# Patient Record
Sex: Male | Born: 1955 | Race: White | Hispanic: No | Marital: Single | Smoking: Current every day smoker
Health system: Southern US, Community
[De-identification: ages and names within clinical notes are randomized; demographics above are authoritative.]

## PROBLEM LIST (undated history)

## (undated) DIAGNOSIS — E079 Disorder of thyroid, unspecified: Secondary | ICD-10-CM

## (undated) DIAGNOSIS — I639 Cerebral infarction, unspecified: Secondary | ICD-10-CM

## (undated) DIAGNOSIS — I10 Essential (primary) hypertension: Secondary | ICD-10-CM

## (undated) DIAGNOSIS — K632 Fistula of intestine: Secondary | ICD-10-CM

## (undated) DIAGNOSIS — B182 Chronic viral hepatitis C: Secondary | ICD-10-CM

## (undated) DIAGNOSIS — I2510 Atherosclerotic heart disease of native coronary artery without angina pectoris: Secondary | ICD-10-CM

## (undated) HISTORY — PX: COLON SURGERY: SHX602

---

## 2010-10-26 ENCOUNTER — Inpatient Hospital Stay: Payer: Self-pay | Admitting: Psychiatry

## 2011-02-03 ENCOUNTER — Inpatient Hospital Stay: Payer: Self-pay | Admitting: Internal Medicine

## 2011-02-08 LAB — PATHOLOGY REPORT

## 2011-06-04 ENCOUNTER — Inpatient Hospital Stay: Payer: Self-pay | Admitting: Specialist

## 2011-06-07 ENCOUNTER — Observation Stay: Payer: Self-pay | Admitting: Unknown Physician Specialty

## 2011-07-29 ENCOUNTER — Ambulatory Visit: Payer: Self-pay | Admitting: Specialist

## 2012-02-18 ENCOUNTER — Emergency Department: Payer: Self-pay | Admitting: Internal Medicine

## 2012-02-18 LAB — CBC
HCT: 47.2 % (ref 40.0–52.0)
HGB: 16.2 g/dL (ref 13.0–18.0)
MCH: 33.4 pg (ref 26.0–34.0)
MCHC: 34.3 g/dL (ref 32.0–36.0)
RBC: 4.85 10*6/uL (ref 4.40–5.90)
WBC: 6 10*3/uL (ref 3.8–10.6)

## 2012-02-18 LAB — BASIC METABOLIC PANEL
Anion Gap: 8 (ref 7–16)
Calcium, Total: 8.8 mg/dL (ref 8.5–10.1)
Chloride: 96 mmol/L — ABNORMAL LOW (ref 98–107)
Co2: 28 mmol/L (ref 21–32)
Creatinine: 0.87 mg/dL (ref 0.60–1.30)
EGFR (Non-African Amer.): >60
Glucose: 110 mg/dL — ABNORMAL HIGH (ref 65–99)
Potassium: 3.3 mmol/L — ABNORMAL LOW (ref 3.5–5.1)

## 2012-02-18 LAB — TROPONIN I: Troponin-I: <0.02 ng/mL

## 2012-04-23 LAB — COMPREHENSIVE METABOLIC PANEL
Bilirubin,Total: 0.7 mg/dL (ref 0.2–1.0)
Chloride: 95 mmol/L — ABNORMAL LOW (ref 98–107)
Co2: 26 mmol/L (ref 21–32)
Creatinine: 0.95 mg/dL (ref 0.60–1.30)
EGFR (African American): >60
EGFR (Non-African Amer.): >60
Osmolality: 263 (ref 275–301)
SGOT(AST): 58 U/L — ABNORMAL HIGH (ref 15–37)

## 2012-04-23 LAB — CBC
MCH: 33.9 pg (ref 26.0–34.0)
MCV: 97 fL (ref 80–100)
Platelet: 313 10*3/uL (ref 150–440)
RBC: 4.34 10*6/uL — ABNORMAL LOW (ref 4.40–5.90)
RDW: 14.2 % (ref 11.5–14.5)

## 2012-04-23 LAB — URINALYSIS, COMPLETE
Bilirubin,UR: NEGATIVE
Glucose,UR: NEGATIVE mg/dL (ref 0–75)
Nitrite: NEGATIVE
Protein: 30
RBC,UR: 35 /HPF (ref 0–5)
Squamous Epithelial: 2
WBC UR: 1492 /HPF (ref 0–5)

## 2012-04-23 LAB — CK TOTAL AND CKMB (NOT AT ARMC): CK, Total: 24 U/L — ABNORMAL LOW (ref 35–232)

## 2012-04-23 LAB — DRUG SCREEN, URINE
Barbiturates, Ur Screen: NEGATIVE (ref ?–200)
Benzodiazepine, Ur Scrn: NEGATIVE (ref ?–200)
Cannabinoid 50 Ng, Ur ~~LOC~~: NEGATIVE (ref ?–50)
Methadone, Ur Screen: NEGATIVE (ref ?–300)

## 2012-04-24 LAB — TROPONIN I: Troponin-I: <0.02 ng/mL

## 2012-04-24 LAB — CK TOTAL AND CKMB (NOT AT ARMC)
CK, Total: 20 U/L — ABNORMAL LOW (ref 35–232)
CK-MB: <0.5 ng/mL — ABNORMAL LOW (ref 0.5–3.6)
CK-MB: <0.5 ng/mL — ABNORMAL LOW (ref 0.5–3.6)

## 2012-04-25 ENCOUNTER — Inpatient Hospital Stay: Payer: Self-pay | Admitting: Internal Medicine

## 2012-04-25 LAB — CBC WITH DIFFERENTIAL/PLATELET
Basophil #: 0 10*3/uL (ref 0.0–0.1)
Basophil %: 0.7 %
HCT: 36.6 % — ABNORMAL LOW (ref 40.0–52.0)
HGB: 13 g/dL (ref 13.0–18.0)
Lymphocyte #: 1.3 10*3/uL (ref 1.0–3.6)
MCH: 34 pg (ref 26.0–34.0)
MCV: 95 fL (ref 80–100)
Monocyte #: 0.7 x10 3/mm (ref 0.2–1.0)
Monocyte %: 10.3 %
Neutrophil #: 4.5 10*3/uL (ref 1.4–6.5)
Neutrophil %: 68.2 %
Platelet: 223 10*3/uL (ref 150–440)
RDW: 13.8 % (ref 11.5–14.5)
WBC: 6.6 10*3/uL (ref 3.8–10.6)

## 2012-04-25 LAB — COMPREHENSIVE METABOLIC PANEL
Albumin: 2.4 g/dL — ABNORMAL LOW (ref 3.4–5.0)
Alkaline Phosphatase: 92 U/L (ref 50–136)
Bilirubin,Total: 0.7 mg/dL (ref 0.2–1.0)
Calcium, Total: 7.5 mg/dL — ABNORMAL LOW (ref 8.5–10.1)
Co2: 30 mmol/L (ref 21–32)
Creatinine: 0.84 mg/dL (ref 0.60–1.30)
EGFR (African American): >60
EGFR (Non-African Amer.): >60
Potassium: 3.4 mmol/L — ABNORMAL LOW (ref 3.5–5.1)
SGOT(AST): 55 U/L — ABNORMAL HIGH (ref 15–37)
SGPT (ALT): 35 U/L (ref 12–78)
Sodium: 135 mmol/L — ABNORMAL LOW (ref 136–145)

## 2012-04-25 LAB — LIPID PANEL
HDL Cholesterol: 15 mg/dL — ABNORMAL LOW (ref 40–60)
Ldl Cholesterol, Calc: 53 mg/dL (ref 0–100)

## 2012-04-25 LAB — MAGNESIUM: Magnesium: 1.3 mg/dL — ABNORMAL LOW

## 2012-04-27 LAB — BASIC METABOLIC PANEL
Calcium, Total: 7.9 mg/dL — ABNORMAL LOW (ref 8.5–10.1)
Co2: 31 mmol/L (ref 21–32)
EGFR (African American): >60
Osmolality: 273 (ref 275–301)
Potassium: 3.5 mmol/L (ref 3.5–5.1)
Sodium: 139 mmol/L (ref 136–145)

## 2012-04-28 ENCOUNTER — Ambulatory Visit: Payer: Self-pay | Admitting: Neurology

## 2012-04-29 LAB — CULTURE, BLOOD (SINGLE)

## 2013-02-28 ENCOUNTER — Emergency Department: Payer: Self-pay | Admitting: Emergency Medicine

## 2013-02-28 LAB — COMPREHENSIVE METABOLIC PANEL
Albumin: 3 g/dL — ABNORMAL LOW (ref 3.4–5.0)
Alkaline Phosphatase: 130 U/L (ref 50–136)
Calcium, Total: 8.2 mg/dL — ABNORMAL LOW (ref 8.5–10.1)
Chloride: 108 mmol/L — ABNORMAL HIGH (ref 98–107)
Glucose: 80 mg/dL (ref 65–99)
Potassium: 3.9 mmol/L (ref 3.5–5.1)
SGOT(AST): 57 U/L — ABNORMAL HIGH (ref 15–37)
SGPT (ALT): 42 U/L (ref 12–78)
Sodium: 143 mmol/L (ref 136–145)
Total Protein: 7.4 g/dL (ref 6.4–8.2)

## 2013-02-28 LAB — URINALYSIS, COMPLETE
Bacteria: NONE SEEN
Bilirubin,UR: NEGATIVE
Blood: NEGATIVE
Ketone: NEGATIVE
Leukocyte Esterase: NEGATIVE
Nitrite: NEGATIVE
Ph: 6 (ref 4.5–8.0)
RBC,UR: <1 /HPF (ref 0–5)
Squamous Epithelial: <1
WBC UR: <1 /HPF (ref 0–5)

## 2013-02-28 LAB — DRUG SCREEN, URINE
Amphetamines, Ur Screen: NEGATIVE (ref ?–1000)
Barbiturates, Ur Screen: NEGATIVE (ref ?–200)
Benzodiazepine, Ur Scrn: POSITIVE (ref ?–200)
MDMA (Ecstasy)Ur Screen: NEGATIVE (ref ?–500)

## 2013-02-28 LAB — CBC
HCT: 41.5 % (ref 40.0–52.0)
MCHC: 33.6 g/dL (ref 32.0–36.0)
Platelet: 344 10*3/uL (ref 150–440)
WBC: 7.5 10*3/uL (ref 3.8–10.6)

## 2013-02-28 LAB — ETHANOL: Ethanol: 259 mg/dL

## 2014-02-26 IMAGING — CR DG CHEST 1V PORT
1 series · 1 of 1 positions shown · non-contrast
Comparison: none

REASON FOR EXAM: trauma
COMMENTS:

[x chest ap]
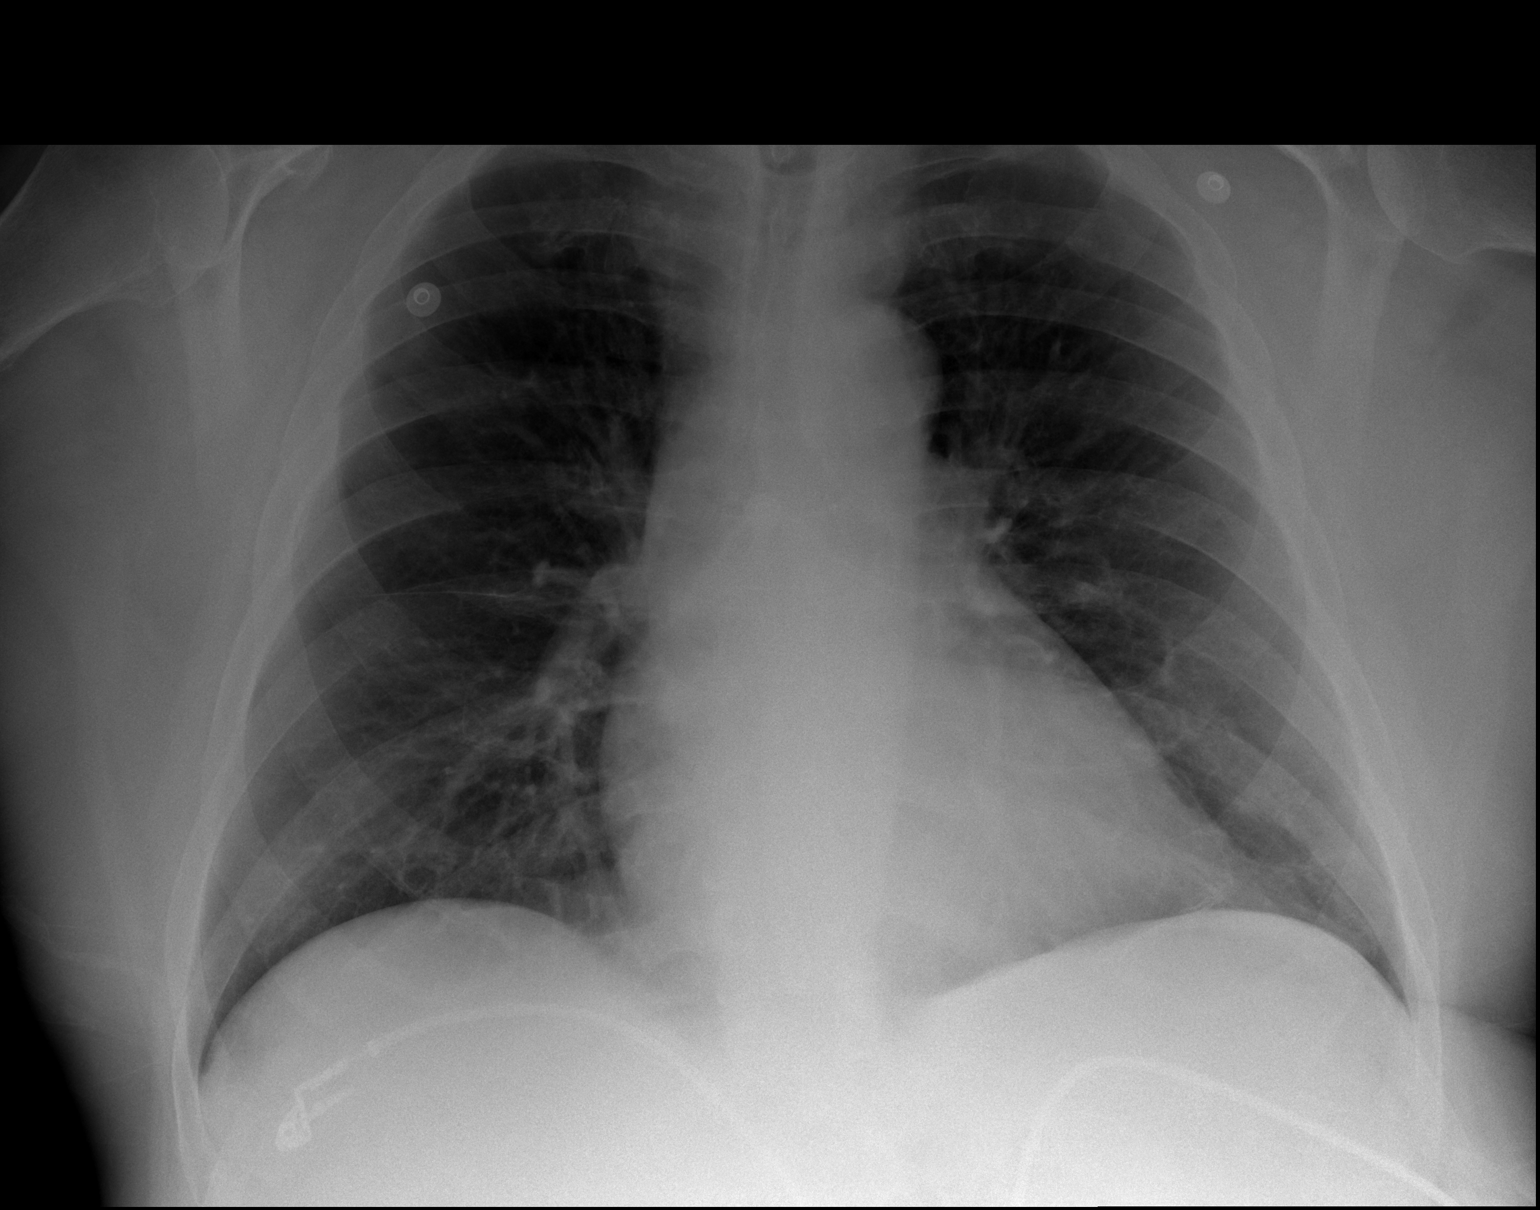

[1 of 1 positions shown; findings below may reference images not displayed]

PROCEDURE:     DXR - DXR PORTABLE CHEST SINGLE VIEW  - February 28, 2013  [DATE]

RESULT:     Comparison is made to the study dated 02/03/2011.

The lungs are clear. The heart and pulmonary vessels are normal. The bony
and mediastinal structures are unremarkable. There is no effusion. There is
no pneumothorax or evidence of congestive failure.
IMPRESSION: No acute cardiopulmonary disease.

[REDACTED]

## 2014-12-09 NOTE — Consult Note (Signed)
I spoke to his daughter who works in my office and she told me that the surgeon in [place] who was planning to do his surgery for the enteric-bladder fistula now recommends the patient go to the [facility] due to the carotid disease.  I spoke to the patient that his medical problems are complicated and he likely would be best served at a medical center like the [facility].   Electronic Signatures: Scot JunElliott, Robert T (MD)  (Signed on 06-Sep-13 21:21)  Authored  Last Updated: 06-Sep-13 21:21 by Scot JunElliott, Robert T (MD)

## 2014-12-09 NOTE — Consult Note (Signed)
I have contacted the [facility] in [place] and will complete the sheet for possible transfer but I do not expect a likely acceptance.  Electronic Signatures: Scot JunElliott, Jimena Wieczorek T (MD)  (Signed on 07-Sep-13 19:18)  Authored  Last Updated: 07-Sep-13 19:18 by Scot JunElliott, Maryama Kuriakose T (MD)

## 2014-12-09 NOTE — Consult Note (Signed)
PATIENT NAME:  [name] Hunter, [name] Hunter MR#:  [id number] DATE OF BIRTH:  1956-04-08  DATE OF CONSULTATION:  04/28/2012  CONSULTING PHYSICIAN:  Walden Statz K. Gearold Wainer, MD  IDENTIFYING INFORMATION: The patient is a 59 year old white male, not employed, disabled with mental disorder and posttraumatic stress disorder and is a [place] veteran.  The patient is divorced for many years and lives by himself.   CHIEF COMPLAINT: The patient comes to [facility] with a chief complaint, "intestinal problems."    PAST PSYCHIATRIC HISTORY: History of inpatient on Psychiatry once before at [facility] for suicide. He talked of suicide but did not make any attempt. He is being followed by [facility] for his posttraumatic stress disorder. He attends classes for the same. He has an appointment coming up very soon.   ALCOHOL AND DRUGS: He admits that he drinks alcohol at a rate of 6 to 7 beers per day.  He does admit to four DWIs. He has not had his driver's license in over 20 years.  He does smoking nicotine cigarettes at a rate of a pack a day for many years. He denies any other street or prescription drug abuse.   MENTAL STATUS EXAM: The patient is alert and oriented to place, person and time, upset and irritable when questions are asked.  He does admit feeling depressed.  He admits feeling hopeless and helpless, worthless and useless about himself. He reports that his mother died in February of 2013, and he did not have a chance or did not have time to overcome from the grief reaction from the same.  He denies suicidal or homicidal attempts.  No evidence of psychosis.  He denies auditory or visual hallucinations. He denies hearing voices, seeing things. He does admit being paranoid and suspicious about people around.  He does admit to flashbacks and nightmares from picking up dead bodies in [place]. Memory is intact.  Cognition is intact.  General knowledge and information is fair.  He  denies any ideas or plans to hurt himself or others.  Insight and judgment are guarded.   IMPRESSION:  1. Posttraumatic stress disorder, chronic, with depression.  2. Bereavement from death of mother in February 2013 with grief reaction and depression secondary to the same.  3. Alcohol dependence, chronic, continuous.  4. Nicotine dependence, chronic, continuous.   RECOMMENDATIONS:  1. Continue current medications of antidepressants, that is Effexor and Geodon.  Add Ambien 10 mg p.o. at bedtime to help him rest better.  2. The patient has an [facility] coming [facility], and he attends posttraumatic stress disorder classes and will continue to do so.   ____________________________ Jannet MantisSurya K. Guss Bundehalla, MD skc:cbb D: 04/28/2012 20:00:30 ET T: 04/29/2012 11:24:50 ET JOB#: [id number]  cc: Monika SalkSurya K. Guss Bundehalla, MD, <Dictator> Beau FannySURYA K Jakolby Sedivy MD ELECTRONICALLY SIGNED 04/29/2012 17:14

## 2014-12-09 NOTE — Consult Note (Signed)
CC: colo-vescicular fistula, alcoholism, syncope.  Pt VSS stable, postural BP much better, 135/80 supine, 112/72 standing.  Talked to Dr. Dava NajjarPanwar and he will go home tomorrow unless [facility] can take him in transfer.  He was last at [facility] 2 months ago seeing a psychiatrist.  I went over with him the improvements and accomplishments of this hospitalization and encouraged him to not drink when he leaves.  I will contact [facility] and see if they will take him tomorrow.and if not then he can go home and follow up with me every few days until he is seen at [facility]/  Electronic Signatures: Scot JunElliott, Robert T (MD)  (Signed on 08-Sep-13 10:07)  Authored  Last Updated: 08-Sep-13 10:07 by Scot JunElliott, Robert T (MD)

## 2014-12-09 NOTE — Consult Note (Signed)
CC: syncope.  Pt daughter spoke to surgeon in [place] area and the surgeon has agreed to take his case. I think he can go home and follow up prn with me. Discussed with Dr. Florina OuPanwahr  Electronic Signatures: Scot JunElliott, Sherisse Fullilove T (MD)  (Signed on 10-Sep-13 11:31)  Authored  Last Updated: 10-Sep-13 11:31 by Scot JunElliott, Choua Chalker T (MD)

## 2014-12-09 NOTE — Discharge Summary (Signed)
PATIENT NAME:  [name] Hunter, [name] Hunter MR#:  [id number] DATE OF BIRTH:  Aug 02, 1956  DATE OF ADMISSION:  04/25/2012 DATE OF DISCHARGE:  05/01/2012  DISCHARGE DIAGNOSES:  1. Acute punctate nonhemorrhagic pontine stroke. 2. Syncope due to orthostasis.  3. 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 5. Post traumatic stress disorder. 6. Anxiety and depression. 7. Hypertension. 8. Gastroesophageal reflux disease. 9. Smoking and alcohol abuse. 10. Hepatitis C.  11. History of polysubstance abuse in the past. 12. Gastroesophageal reflux disease. 13. Sleep apnea, not tolerating CPAP.   DISPOSITION: The patient is being discharged home with home health and physical therapy.   CONSULTANTS:  1. Lynnae Prude, MD - Gastroenterology. 2. Suzan Slick, MD - Neurology. 3. Beryle Beams, MD - Neurology. 4. Margarita Rana, MD - Psychiatry. 5. Dionne Milo, MD - Surgery. 6. Festus Barren, MD - Vascular Surgery.  RESULTS: Microbiology - Blood cultures with no growth so far. Urine culture with gram-positive rods, likely colonization.  White count normal, hemoglobin 13, and platelet count normal at 223. Glucose 145, creatinine 0.95, and sodium 131 on admission and normal by the time of discharge. Other electrolytes were normal. VLDL 23, LDL 53, cholesterol 91, triglycerides 117, and HDL 15. LFTs normal, other than AST of 55. Cardiac enzymes normal. Urine drug screen positive for opiates and tricyclic-like antidepressants.   Echocardiogram was essentially a normal study.   CT angiogram showed complete occlusion of the left common carotid artery to the level of the carotid bulb. Mild to moderate area of stenosis within the carotid bulb on the right.  MRI of the brain showed acute nonhemorrhagic punctate central pontine infarct.   MRA of the brain showed absence of the left intracranial internal carotid artery consistent with occlusion with reconstitution of the left MCA and ACA.   Carotid  ultrasound showed left carotid stenosis of 40 to 45%, right carotid stenosis.  Knee x-ray: Bilateral knee x-ray showed no acute abnormalities.   EEG showed diffuse background slowing.   DISCHARGE INSTRUCTIONS: The patient is being discharged home. He is to followup with Dr. Mechele Collin, PCP at [facility], Dr. Wyn Quaker, and Dr. Kemper Durie in 1 to 2 weeks after discharge. The patient has been advised to wear TED/compression stockings.   DIET: Low sodium.   DISCHARGE MEDICATIONS:  1. Omeprazole 20 mg daily.  2. Morphine 30 mg twice a day. 3. Morphine immediate release 15 mg every six hours p.r.n.  4. Cipro 500 mg twice a day. 5. Flagyl 500 mg three times daily. 6. Venlafaxine 225 mg once a day.  7. Simvastatin 20 mg daily.  8. Aspirin 81 mg daily.  9. Plavix 75 mg daily.  10. Lopressor 25 mg twice a day. 11. Ambien 10 mg at bedtime.   HOSPITAL COURSE: The patient is a 59 year old male with past medical history of polysubstance abuse, ongoing smoking, alcohol abuse, hepatitis C, PTSD, anxiety, depression, and hypertension who had been having recurrent syncopal episodes. Therefore, he was brought in by his family for evaluation. 1. Syncope. The differential diagnosis included orthostasis, medications including HCTZ/Geodon, carotid stenosis, and alcohol abuse. He had extensive work-up including echo which was essentially normal. His carotid ultrasound showed that he had occlusion of the carotid artery on the left side. Therefore CTA was done which showed 100% blockage in the left and mild to moderate on the right. Vascular surgery consultation with Dr. Wyn Quaker was obtained who felt that no intervention could be done on the left side due to chronic occlusion. He recommended doing ultrasounds  every 3 to 6 months to check for progression of the right side and evaluation for stents at that time. The patient was orthostatic and that was felt to be the primary reason for his syncope. A neurology consultation was obtained  because the family was very concerned. They also felt that the patient's syncope was related to orthostasis. EEG was done which showed no epileptiform activity other than mild generalized background slowing. MRA showed occluded left internal carotid artery. Psychiatry was also consulted to adjust the patient's medications who felt that the patient was stable on his current medication regimen. 2. Acute cerebrovascular accident. The patient was found to have acute nonhemorrhagic punctate central pontine infarct on his MRI of the brain. He is already on aspirin and Plavix and statin therapy. He was evaluated by physical therapy and home health was recommended.  3. Enterovesical fistula. The patient has surgery scheduled as an outpatient for repair of his fistula at the [facility]. He is on antibiotics, Cipro and Flagyl, which were continued. The patient has intermittently been passing stool in his urine and had a positive urinalysis initially. The urine culture grew gram-positive rods which was likely colonization. Given the patient's carotid stenosis, he is at high risk of having stroke during surgery.  4. Alcohol abuse. The patient abuses alcohol daily and was placed on CIWA protocol. He did go into alcohol withdrawal, but has completed his withdrawal and is currently not having any withdrawal symptoms. 5. Hypertension. Because of orthostatic hypotension, the patient's diuretic therapy has been discontinued. He is on a beta blocker.    DISPOSITION: The patient had a lot of social issues and his family initially wanted [facility] to be [facility]. [facility] and they accepted him, but a bed was not available. After much discussion amongst themselves, the family finally decided to take the patient home with home health and physical therapy. The patient was discharged home in a stable condition.   TIME SPENT: 45 minutes. ____________________________ Darrick MeigsSangeeta Raphaella Larkin,  MD sp:slb D: 05/01/2012 15:50:36 ET T: 05/02/2012 10:38:59 ET JOB#: [id number]  cc: Darrick MeigsSangeeta Javis Abboud, MD, <Dictator> [facility]; FAX: [phone]) Darrick MeigsSANGEETA Juniel Groene MD ELECTRONICALLY SIGNED 05/02/2012 15:58

## 2014-12-09 NOTE — Consult Note (Signed)
Brief Consult Note: Diagnosis: colovesical fistula.   Patient was seen by consultant.   Consult note dictated.   Comments: pt has [facility] scheduled with [facility] in [facility] weeks. No surgical needs here this admission. will sign off.  Electronic Signatures: Lattie Hawooper, Megumi Treaster E (MD)  (Signed 06-Sep-13 13:31)  Authored: Brief Consult Note   Last Updated: 06-Sep-13 13:31 by Lattie Hawooper, Riannah Stagner E (MD)
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
## 2014-12-09 NOTE — Consult Note (Signed)
Patient admitted with syncope.  US suggests left ICA occlusion and maybe 50% right ICA stenosis although velocities may be elevated from contralateral occlusion.  Will get CT angiogram to confirm.  If correct, no surgical or interventional therapy of benefit.  Will see in office in follow up to watch patent carotid ongoing  Electronic Signatures: Annice Needyew, Jason S (MD)  (Signed on 04-Sep-13 10:32)  Authored  Last Updated: 04-Sep-13 10:32 by Annice Needyew, Jason S (MD)

## 2014-12-09 NOTE — Consult Note (Signed)
PATIENT NAME:  [name] Hunter, [name] Hunter MR#:  [id number] DATE OF BIRTH:  December 18, 1955  DATE OF CONSULTATION:  04/30/2012  REFERRING PHYSICIAN:   CONSULTING PHYSICIAN:  Rose Phi. 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 Katrinka Blazing at [facility] with past history of posttraumatic stress disorder, depression and anxiety, agitation, hypertension, obesity, untreated sleep apnea, hepatitis C, 1-1/2 pack per day tobacco use, 6 to 7 24-ounce beer per day ethanol abuse, and recent diagnosis of enterovesical fistula. He was admitted early 04/24/2012 for weakness and loss of consciousness and is seen today regarding stroke. History comes from the patient and from his hospital chart.   The patient was brought to the Emergency Room at 4:20 p.m. on 04/23/2012 by his daughter whom the patient had called secondary to feeling somewhat weak and passing liquid stool while urination. ER nurse and physician and admitting physician does give history of episodes of loss of consciousness. He was seen by Dr. Olin Pia of neurology on 04/28/2012 regarding syncope.   The patient is a fair historian, poor with regard to chronology of spells including falling and with regard to total number of episodes, and fairly good on giving description of the episodes themselves. He reports episodes occurring when on his feet and he will have the onset of feeling about to pass out and will be wobbly for a few seconds before passing out and falling. On one occasion he was able to walk a few steps from the kitchen to the den where he sat or collapsed sitting into a chair without loss of consciousness. With one spell, perhaps his first episode, dizziness began while standing urinating. Otherwise all spells had begun while standing or walking and none have been associated with standing up, looking up, or bending over. He reports  being wobbly on his feet without feeling of presyncope when going to the bathroom with nurse on Friday, 04/27/2012 and reports that this was the case later the same day when walking to the bathroom as well. Since then he has not felt similarly unsteady, but has been using a two-wheeled walker brought to his room because of feeling "unsure".    He has had orthostatic blood pressure and heart rate checks the morning of 09/08 and 04/30/2012. Change in mean arterial blood pressure was 17 mm of mercury on the 8th and 15 on the 9th. His workup in the hospital has been most notable for finding of occlusion of the left common carotid artery. CT arteriogram had shown there was some flow in the left internal and external carotid arteries. Brain MRI scan performed this morning shows a very small spot of altered signal intensity of the central pons on diffusion weighted imaging, consistent with recent nonhemorrhagic stroke. Patient denies any prior history of episodes of stroke or temporary stroke.   PHYSICAL EXAMINATION: Patient is a well-developed, moderately overweight white gentleman who is examined lying semisupine in no apparent distress. Blood pressure 130/80 and heart rate 92. There is no fever. He was normocephalic without evidence of trauma. His neck was supple. He was alert and was oriented with clear speech and normal expression and normal affect. He was a fair historian as noted above. Cranial nerve examination showed symmetric facial appearance at rest and with directed movements with conversation, normal facial sensation, normal eye movements full visual fields for each eye; visual acuity was not tested. Hearing acuity was normal. Examination  of the oropharynx showed normal midline uvula elevation with phonation and normal appearance of tongue and normal tongue movements. Motor examination of the extremities showed normal tone and bulk throughout and symmetric full power throughout. Extremity coordination was  normal. His gait was not tested.   IMPRESSION:  1. 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 I cannot rule out possibility of superimposed cardiac dysrhythmia. From the description of his episodes, I do not have strong suspicion of seizures.  2. Very small area of recent nonhemorrhagic strokes in the central pons on brain MRI scan. Historically, I suspect that this may be residual of small stroke on Friday, 04/27/2012, when he was unsteady on his feet with the nurse getting back and forth to the bathroom.   RECOMMENDATIONS:  1. I agree with his present work-up and treatment in the hospital including initiation of moderate compression thigh-high support hose during the day.  2. He will benefit from being on daily aspirin as antiplatelet agent.  3. He is encouraged to consider tapering and discontinuing ethanol and tobacco use.  4. I do not expect that his very small area of stroke seen on MRI scan likely present results in neurologic deficit. I do not expect that this area poses a particular risk with regard to procedures such as anticipated surgery for his enterovesical fistula. He is at risk for any procedure in the setting of insufficient cerebrovascular blood flow from carotid artery occlusion on the left.   I appreciate being asked to see this pleasant and interesting gentleman.   ____________________________ Rose PhiPeter R. Kemper Durielarke, MD prc:cms D: 04/30/2012 17:46:18 ET T: 05/01/2012 09:14:23  ET JOB#: [id number]  cc: Rose PhiPeter R. Kemper Durielarke, MD, <Dictator>  Gaspar GarbePETER R Jennise Both MD ELECTRONICALLY SIGNED 05/07/2012 13:14
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
## 2014-12-09 NOTE — Consult Note (Signed)
CC: syncope.  Pt daughter works in my office and she asked me to come in on the case.  I talked to Dr.Dew about surgery on his abd in face of the carotid disease and he felt it was likely stable enough to go ahead with surgery.  This would be somewhat difficult surgery due to the complexity of the colovescicular problem but no need to postpone this to October from the carotid standpoint.  Please consult Dr. Michela [facility] for possible surgery when his DT's clear up and will likely need urologist.  Pt is an alcoholic and family concerned he will start drinking again if goes home before surgery done.  I will follow with you;.  Electronic Signatures: Scot JunElliott, Toniesha Zellner T (MD)  (Signed on 05-Sep-13 18:10)  Authored  Last Updated: 05-Sep-13 18:10 by Scot JunElliott, Nathyn Luiz T (MD)
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
## 2014-12-09 NOTE — Consult Note (Signed)
PATIENT NAME:  [name] Hunter, [name] Hunter#:  [id number] DATE OF BIRTH:  [birth year]  DATE OF CONSULTATION:  04/27/2012  REFERRING PHYSICIAN:   CONSULTING PHYSICIAN:  Adah Salvageichard E. Excell Seltzerooper, MD  CHIEF COMPLAINT: Colovesical fistula.   HISTORY OF PRESENT ILLNESS: This is a 59 year old Caucasian male patient. He has been admitted to the hospital for weakness and syncopal episodes and has been undergoing a considerable work-up for carotid disease.   I was asked to see the patient for the known diagnosis of colovesical fistula or enterovesical fistula.   The patient describes passage of both stool and gas or air via his urine and that's been going on for several weeks. He cannot remember exactly when. He has also had occasional or frequent urinary tract infections. He has been seen by [facility]rology team at [facility]. [facility] [place] but [facility] and has a planned operation for repair of his colovesical fistula by that team on October 3rd.    The patient has no new complaints today over what his normal problems have been recently.   PAST MEDICAL HISTORY:  1. PTSD. 2. Reflux disease. 3. Anxiety/depression. 4. Tobacco abuse.  5. Hypertension.  6. Sleep apnea.  7. Hepatitis C.  8. Alcohol abuse. 9. Drug abuse.   PAST SURGICAL HISTORY:  1. Knee surgery. 2. Recent colonoscopy and cystoscopy.   MEDICATIONS:  1. Antibiotics. 2. Analgesics including narcotic analgesics. 3. Venlafaxine. 4. Norvasc. 5. Omeprazole. 6. Hydrochlorothiazide.   SOCIAL HISTORY: The patient smokes tobacco. Has a history of alcohol and polysubstance abuse. He is not employed at this point.  REVIEW OF SYSTEMS: 10 system review was performed and negative with the exception of that mentioned in the history of present illness.   PHYSICAL EXAMINATION:   GENERAL: Comfortable appearing Caucasian male patient with a BMI of 28.   VITAL SIGNS:  Temperature 99.3, pulse 75, respirations 18, blood pressure 128/82, 95% room air sat.   HEENT: Poor dentition.   NECK: No palpable neck nodes.   CHEST: Clear to auscultation.   CARDIAC: Regular rate and rhythm.   ABDOMEN: Soft, nontender, nondistended. No scars are noted.   EXTREMITIES: Without edema.   NEUROLOGIC: Grossly intact.   INTEGUMENTARY: No jaundice.   LABORATORY, DIAGNOSTIC, AND RADIOLOGICAL DATA: Laboratory values demonstrate a normal set of electrolytes done today. Prior labs done on the 4th demonstrated a creatinine of 0.84, otherwise normal electrolytes and a hemogram showing a white blood cell count of 6.6, hemoglobin and hematocrit of 13 and 37, and a platelet count of 223. No imaging studies are available concerning his abdomen.   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 He came to the hospital on p.o. antibiotics and is being maintained on antibiotics. He is scheduled [facility] for repair of this colovesical or enterovesical fistula. I have nothing to add surgically. He is not in any acute distress concerning this and will be well cared for with the [facility]rologic team [facility].   Thank you for this consult. If you have any questions I can certainly see the patient again while he is in the hospital for work-up of his vascular disease.   ____________________________ Adah Salvageichard E. Excell Seltzerooper, MD rec:drc D: 04/27/2012 18:16:04 ET T: 04/28/2012 11:37:29 ET JOB#: [id number]  cc: Adah Salvageichard E. Excell Seltzerooper, MD, <Dictator> Lattie HawICHARD E Slyvester Latona MD ELECTRONICALLY SIGNED  04/29/2012 14:13 

## 2015-02-10 ENCOUNTER — Encounter: Payer: Self-pay | Admitting: Occupational Medicine

## 2015-02-10 ENCOUNTER — Other Ambulatory Visit: Payer: Self-pay

## 2015-02-10 ENCOUNTER — Emergency Department: Payer: Medicare Other

## 2015-02-10 ENCOUNTER — Emergency Department
Admission: EM | Admit: 2015-02-10 | Discharge: 2015-02-11 | Disposition: A | Discharge status: Home / Self Care | Payer: Medicare Other | Attending: Emergency Medicine | Admitting: Emergency Medicine

## 2015-02-10 DIAGNOSIS — R079 Chest pain, unspecified: Secondary | ICD-10-CM

## 2015-02-10 DIAGNOSIS — Z720 Tobacco use: Secondary | ICD-10-CM | POA: Insufficient documentation

## 2015-02-10 DIAGNOSIS — F10129 Alcohol abuse with intoxication, unspecified: Secondary | ICD-10-CM | POA: Insufficient documentation

## 2015-02-10 DIAGNOSIS — R0789 Other chest pain: Secondary | ICD-10-CM | POA: Insufficient documentation

## 2015-02-10 DIAGNOSIS — I10 Essential (primary) hypertension: Secondary | ICD-10-CM | POA: Insufficient documentation

## 2015-02-10 DIAGNOSIS — F10920 Alcohol use, unspecified with intoxication, uncomplicated: Secondary | ICD-10-CM

## 2015-02-10 HISTORY — DX: Disorder of thyroid, unspecified: E07.9

## 2015-02-10 HISTORY — DX: Essential (primary) hypertension: I10

## 2015-02-10 HISTORY — DX: Cerebral infarction, unspecified: I63.9

## 2015-02-10 HISTORY — DX: Chronic viral hepatitis C: B18.2

## 2015-02-10 HISTORY — DX: Atherosclerotic heart disease of native coronary artery without angina pectoris: I25.10

## 2015-02-10 HISTORY — DX: Fistula of intestine: K63.2

## 2015-02-10 LAB — URINALYSIS COMPLETE WITH MICROSCOPIC (ARMC ONLY)
Bilirubin Urine: NEGATIVE
Glucose, UA: NEGATIVE mg/dL
Hgb urine dipstick: NEGATIVE
KETONES UR: NEGATIVE mg/dL
Leukocytes, UA: NEGATIVE
Nitrite: NEGATIVE
PH: 6 (ref 5.0–8.0)
Protein, ur: NEGATIVE mg/dL
SQUAMOUS EPITHELIAL / LPF: NONE SEEN
Specific Gravity, Urine: 1.002 — ABNORMAL LOW (ref 1.005–1.030)

## 2015-02-10 LAB — CBC WITH DIFFERENTIAL/PLATELET
Basophils Absolute: 0 10*3/uL (ref 0–0.1)
Basophils Relative: 1 %
Eosinophils Absolute: 0 10*3/uL (ref 0–0.7)
Eosinophils Relative: 1 %
HCT: 42.2 % (ref 40.0–52.0)
Hemoglobin: 14 g/dL (ref 13.0–18.0)
LYMPHS PCT: 35 %
Lymphs Abs: 2.2 10*3/uL (ref 1.0–3.6)
MCH: 30 pg (ref 26.0–34.0)
MCHC: 33.2 g/dL (ref 32.0–36.0)
MCV: 90.3 fL (ref 80.0–100.0)
MONOS PCT: 8 %
Monocytes Absolute: 0.5 10*3/uL (ref 0.2–1.0)
NEUTROS PCT: 55 %
Neutro Abs: 3.4 10*3/uL (ref 1.4–6.5)
Platelets: 172 10*3/uL (ref 150–440)
RBC: 4.67 MIL/uL (ref 4.40–5.90)
RDW: 15.5 % — ABNORMAL HIGH (ref 11.5–14.5)
WBC: 6.2 10*3/uL (ref 3.8–10.6)

## 2015-02-10 LAB — TROPONIN I: Troponin I: <0.03 ng/mL (ref <0.031)

## 2015-02-10 LAB — COMPREHENSIVE METABOLIC PANEL
ALT: 64 U/L — AB (ref 17–63)
ANION GAP: 14 (ref 5–15)
AST: 71 U/L — ABNORMAL HIGH (ref 15–41)
Albumin: 3.9 g/dL (ref 3.5–5.0)
Alkaline Phosphatase: 91 U/L (ref 38–126)
BUN: 7 mg/dL (ref 6–20)
CALCIUM: 9.4 mg/dL (ref 8.9–10.3)
CO2: 24 mmol/L (ref 22–32)
CREATININE: 0.77 mg/dL (ref 0.61–1.24)
Chloride: 104 mmol/L (ref 101–111)
GLUCOSE: 77 mg/dL (ref 65–99)
Potassium: 3.7 mmol/L (ref 3.5–5.1)
SODIUM: 142 mmol/L (ref 135–145)
Total Bilirubin: 0.6 mg/dL (ref 0.3–1.2)
Total Protein: 7.8 g/dL (ref 6.5–8.1)

## 2015-02-10 LAB — LIPASE, BLOOD: Lipase: 48 U/L (ref 22–51)

## 2015-02-10 LAB — ETHANOL: Alcohol, Ethyl (B): 115 mg/dL — ABNORMAL HIGH (ref <5)

## 2015-02-10 MED ORDER — ASPIRIN 81 MG PO CHEW
CHEWABLE_TABLET | ORAL | Status: AC
  Administered 2015-02-10: 324 mg via ORAL
  Filled 2015-02-10: qty 4

## 2015-02-10 MED ORDER — NITROGLYCERIN 0.4 MG SL SUBL
SUBLINGUAL_TABLET | SUBLINGUAL | Status: AC
  Filled 2015-02-10: qty 1

## 2015-02-10 MED ORDER — GI COCKTAIL ~~LOC~~
30.0000 mL | Freq: Once | ORAL | Status: AC
Start: 2015-02-10 — End: 2015-02-10
  Administered 2015-02-10: 30 mL via ORAL

## 2015-02-10 MED ORDER — NITROGLYCERIN 0.4 MG SL SUBL
SUBLINGUAL_TABLET | SUBLINGUAL | Status: AC
  Administered 2015-02-10: 0.4 mg via SUBLINGUAL
  Filled 2015-02-10: qty 1

## 2015-02-10 MED ORDER — GI COCKTAIL ~~LOC~~
ORAL | Status: AC
  Administered 2015-02-10: 30 mL via ORAL
  Filled 2015-02-10: qty 30

## 2015-02-10 MED ORDER — ASPIRIN 81 MG PO CHEW
324.0000 mg | CHEWABLE_TABLET | Freq: Once | ORAL | Status: AC
  Administered 2015-02-10: 324 mg via ORAL

## 2015-02-10 MED ORDER — NITROGLYCERIN 0.4 MG SL SUBL
0.4000 mg | SUBLINGUAL_TABLET | SUBLINGUAL | Status: DC | PRN
  Administered 2015-02-10 (×2): 0.4 mg via SUBLINGUAL

## 2015-02-10 MED ORDER — SODIUM CHLORIDE 0.9 % IV BOLUS (SEPSIS)
1000.0000 mL | Freq: Once | INTRAVENOUS | Status: AC
  Administered 2015-02-10: 1000 mL via INTRAVENOUS

## 2015-02-10 NOTE — ED Provider Notes (Signed)
[facility] Emergency Department Provider Note  ____________________________________________  Time seen: Approximately 930 PM  I have reviewed the triage vital signs and the nursing notes.   HISTORY  Chief Complaint Chest Pain    HPI [name] Hunter is a 59 y.o. male with a history of CAD and stroke who presents today with 3-4 days of left-sided chest pain. The patient says that the pain is sharp and nonradiating. There are no exacerbating factors. There is no shortness of breath, nausea or vomiting associated with it. However he does have associated dizziness. The patient says that this past March that he was catheterized at the [facility] for chest pain with elevated troponins but did not have any stents because he did not have any stents of the lesions. The patient usually takes a daily aspirin of 324 mg but has not taken it today. He also has nitroglycerin at home which she also did not take today. He says that the pain can last up to 30 minutes at a time. He says that he is having pain right now which she describes as left-sided and moderate. He also admits to drinking 2 beers earlier tonight.Despite initial triage complaint of abdominal pain the patient denies any abdominal pain. Says that it is all left-sided chest pain and not left upper quadrant abdominal pain.   Past Medical History  Diagnosis Date  . Coronary artery disease   . Hypertension   . Thyroid disease   . Stroke   . Colonic fistula   . Hep C w/o coma, chronic     There are no active problems to display for this patient.   Past Surgical History  Procedure Laterality Date  . Colon surgery      No current outpatient prescriptions on file.  Allergies Codeine  History reviewed. No pertinent family history.  Social History History  Substance Use Topics  . Smoking status: Current Every Day Smoker    Types: Cigarettes  . Smokeless tobacco: Not on file  . Alcohol Use: Yes     Review of Systems Constitutional: No fever/chills Eyes: No visual changes. ENT: No sore throat. Cardiovascular: As above  Respiratory: Denies shortness of breath. Gastrointestinal: No abdominal pain.  No nausea, no vomiting.  No diarrhea.  No constipation. Genitourinary: Negative for dysuria. Musculoskeletal: Negative for back pain. Skin: Negative for rash. Neurological: Negative for headaches, focal weakness or numbness.  10-point ROS otherwise negative.  ____________________________________________   PHYSICAL EXAM:  VITAL SIGNS: ED Triage Vitals  Enc Vitals Group     BP 02/10/15 2100 151/91 mmHg     Pulse Rate 02/10/15 2100 93     Resp 02/10/15 2100 17     Temp --      Temp src --      SpO2 02/10/15 2100 95 %     Weight --      Height --      Head Cir --      Peak Flow --      Pain Score 02/10/15 2106 9     Pain Loc --      Pain Edu? --      Excl. in GC? --     Constitutional: Alert and oriented. Well appearing and in no acute distress. Eyes: Conjunctivae are normal. PERRL. EOMI. Head: Atraumatic. Nose: No congestion/rhinnorhea. Mouth/Throat: Mucous membranes are moist.  Oropharynx non-erythematous. Neck: No stridor.   Cardiovascular: Normal rate, regular rhythm. Grossly normal heart sounds.  Good peripheral circulation. Respiratory:  Normal respiratory effort.  No retractions. Lungs CTAB. Gastrointestinal: Soft and nontender. No distention. No abdominal bruits. No CVA tenderness. Has left lower quadrant colostomy. Was performed remotely secondary to fistula. Daughter says fistula was secondary to severe constipation. No tenderness surrounding colostomy. No blood in the colostomy bag. No surrounding erythema or pus. Colostomy mucosa appears pink and healthy. Musculoskeletal: No lower extremity tenderness nor edema.  No joint effusions. Neurologic:  Normal speech and language. No gross focal neurologic deficits are appreciated. Speech is normal. No gait  instability. Skin:  Skin is warm, dry and intact. No rash noted. Psychiatric: Mood and affect are normal. Speech and behavior are normal.  ____________________________________________   LABS (all labs ordered are listed, but only abnormal results are displayed)  Labs Reviewed  CBC WITH DIFFERENTIAL/PLATELET - Abnormal; Notable for the following:    RDW 15.5 (*)    All other components within normal limits  COMPREHENSIVE METABOLIC PANEL - Abnormal; Notable for the following:    AST 71 (*)    ALT 64 (*)    All other components within normal limits  URINALYSIS COMPLETEWITH MICROSCOPIC ([facility] ONLY) - Abnormal; Notable for the following:    Color, Urine STRAW (*)    APPearance CLEAR (*)    Specific Gravity, Urine 1.002 (*)    Bacteria, UA RARE (*)    All other components within normal limits  ETHANOL - Abnormal; Notable for the following:    Alcohol, Ethyl (B) 115 (*)    All other components within normal limits  LIPASE, BLOOD  TROPONIN I  URINE DRUG SCREEN, QUALITATIVE ([facility] ONLY)  TROPONIN I   ____________________________________________  EKG  ED ECG REPORT I, Arelia Longest, the attending physician, personally viewed and interpreted this ECG.   Date: 02/10/2015  EKG Time: 2055  Rate: 81  Rhythm: normal sinus rhythm  Axis: Normal  Intervals:none  ST&T Change: T-wave inversion in lead V2 likely related to lead position. Otherwise no abnormal T-wave inversions. No ST elevations or depressions. No change from previous EKG from 2014.  ____________________________________________  RADIOLOGY  Chest x-ray without acute cardiopulmonary process. Personally reviewed film and agree with the radiologist's reads ____________________________________________   PROCEDURES  Procedure(s) performed: None  Critical Care performed: No  ____________________________________________   INITIAL IMPRESSION / ASSESSMENT AND PLAN / ED COURSE  Pertinent labs & imaging results  that were available during my care of the patient were reviewed by me and considered in my medical decision making (see chart for details).  ----------------------------------------- 11:28 PM on 02/10/2015 -----------------------------------------  Pain improved with GI cocktail. Patient sitting up in bed eating sandwich tray without any acute distress. Aware of repeat troponin. UDS pending. Plan will be to follow second troponin. If negative will be able to discharged for follow-up at the [facility] with his known cardiologist. Signed out to Dr. Dolores Frame at 11:30 PM.  ____________________________________________   FINAL CLINICAL IMPRESSION(S) / ED DIAGNOSES  Acute chest pain. Acute alcohol intoxication. Initial visit.    Myrna Blazer, MD 02/10/15 [phone]

## 2015-02-10 NOTE — ED Notes (Signed)
Pt still uncooperative taking his monitor stuff wanting to go home explain to the patient he needed to stay for iv fluids and repeat blood draw MD aware pt asked for food and drink MD said ok pt given those things

## 2015-02-10 NOTE — ED Notes (Signed)
Pt arrived via EMS from home where he lives by himself. EMS reports c/o of ABD pain LUQ since 5pm IV placed to left hand vs stable. Started work up from ABD pain then his daughter shows up and tells [place] he called her saying he was having CP today. They called EMS after talking more to the pt he states I have CP for the last four days. Denies radiation, vomiting. Pt states her has left side chest pain 9/10 sharp and shooting pt has chronic pain and pain management. He reports SOB Dizzy with chest pain. Pt has a colostomy.Pt is  Alert and oriented but slurred speech when he gets worked up. Pt arrived not wanting to be here beligent uncooperative calm down for [place] to do what we needed to do

## 2015-02-10 NOTE — ED Notes (Signed)
Cleaning pt from urinating on him self placed paper scrub pants. Pt crying very  Emotional and sometimes slurred speech. At 2200 was wanting to leave.

## 2015-02-10 NOTE — ED Notes (Signed)
Made MD aware that pts pain not better after 1st given a second MD order GI cocktail.

## 2015-02-11 LAB — TROPONIN I: Troponin I: <0.03 ng/mL (ref <0.031)

## 2015-02-11 NOTE — ED Provider Notes (Signed)
-----------------------------------------   1:18 AM on 02/11/2015 ----------------------------------------- Repeat troponin negative. Lab is still processing UDS. Patient has removed monitor leads and is eager for discharge. Discussed with patient and daughter and given strict return precautions. Both verbalize understanding and agree with plan of care.   Irean Hong, MD 02/11/15 [phone]

## 2015-09-04 ENCOUNTER — Encounter: Payer: Self-pay | Admitting: Emergency Medicine

## 2015-09-04 ENCOUNTER — Emergency Department: Payer: Medicare Other

## 2015-09-04 ENCOUNTER — Emergency Department
Admission: EM | Admit: 2015-09-04 | Discharge: 2015-09-04 | Disposition: A | Discharge status: Home / Self Care | Payer: Medicare Other | Attending: Emergency Medicine | Admitting: Emergency Medicine

## 2015-09-04 DIAGNOSIS — F1910 Other psychoactive substance abuse, uncomplicated: Secondary | ICD-10-CM | POA: Insufficient documentation

## 2015-09-04 DIAGNOSIS — F329 Major depressive disorder, single episode, unspecified: Secondary | ICD-10-CM | POA: Diagnosis not present

## 2015-09-04 DIAGNOSIS — Z046 Encounter for general psychiatric examination, requested by authority: Secondary | ICD-10-CM | POA: Diagnosis present

## 2015-09-04 DIAGNOSIS — F1994 Other psychoactive substance use, unspecified with psychoactive substance-induced mood disorder: Secondary | ICD-10-CM | POA: Diagnosis not present

## 2015-09-04 DIAGNOSIS — I10 Essential (primary) hypertension: Secondary | ICD-10-CM | POA: Diagnosis not present

## 2015-09-04 DIAGNOSIS — G8929 Other chronic pain: Secondary | ICD-10-CM | POA: Diagnosis not present

## 2015-09-04 DIAGNOSIS — F1010 Alcohol abuse, uncomplicated: Secondary | ICD-10-CM

## 2015-09-04 DIAGNOSIS — F1193 Opioid use, unspecified with withdrawal: Secondary | ICD-10-CM

## 2015-09-04 DIAGNOSIS — F17210 Nicotine dependence, cigarettes, uncomplicated: Secondary | ICD-10-CM | POA: Insufficient documentation

## 2015-09-04 DIAGNOSIS — F32A Depression, unspecified: Secondary | ICD-10-CM

## 2015-09-04 DIAGNOSIS — R45851 Suicidal ideations: Secondary | ICD-10-CM | POA: Diagnosis not present

## 2015-09-04 DIAGNOSIS — F1123 Opioid dependence with withdrawal: Secondary | ICD-10-CM

## 2015-09-04 DIAGNOSIS — F4310 Post-traumatic stress disorder, unspecified: Secondary | ICD-10-CM

## 2015-09-04 LAB — URINE DRUG SCREEN, QUALITATIVE (ARMC ONLY)
Amphetamines, Ur Screen: NOT DETECTED
BARBITURATES, UR SCREEN: NOT DETECTED
Benzodiazepine, Ur Scrn: NOT DETECTED
CANNABINOID 50 NG, UR ~~LOC~~: NOT DETECTED
Cocaine Metabolite,Ur ~~LOC~~: NOT DETECTED
MDMA (ECSTASY) UR SCREEN: NOT DETECTED
METHADONE SCREEN, URINE: NOT DETECTED
Opiate, Ur Screen: NOT DETECTED
Phencyclidine (PCP) Ur S: NOT DETECTED
TRICYCLIC, UR SCREEN: NOT DETECTED

## 2015-09-04 LAB — CBC WITH DIFFERENTIAL/PLATELET
BASOS PCT: 1 %
Basophils Absolute: 0 10*3/uL (ref 0–0.1)
EOS ABS: 0.2 10*3/uL (ref 0–0.7)
EOS PCT: 3 %
HCT: 43 % (ref 40.0–52.0)
Hemoglobin: 14.8 g/dL (ref 13.0–18.0)
LYMPHS ABS: 2.7 10*3/uL (ref 1.0–3.6)
Lymphocytes Relative: 41 %
MCH: 32.6 pg (ref 26.0–34.0)
MCHC: 34.5 g/dL (ref 32.0–36.0)
MCV: 94.5 fL (ref 80.0–100.0)
Monocytes Absolute: 0.6 10*3/uL (ref 0.2–1.0)
Monocytes Relative: 10 %
NEUTROS PCT: 45 %
Neutro Abs: 2.9 10*3/uL (ref 1.4–6.5)
PLATELETS: 163 10*3/uL (ref 150–440)
RBC: 4.55 MIL/uL (ref 4.40–5.90)
RDW: 15.6 % — ABNORMAL HIGH (ref 11.5–14.5)
WBC: 6.4 10*3/uL (ref 3.8–10.6)

## 2015-09-04 LAB — COMPREHENSIVE METABOLIC PANEL
ALK PHOS: 69 U/L (ref 38–126)
ALT: 107 U/L — AB (ref 17–63)
ANION GAP: 8 (ref 5–15)
AST: 176 U/L — ABNORMAL HIGH (ref 15–41)
Albumin: 3.1 g/dL — ABNORMAL LOW (ref 3.5–5.0)
BILIRUBIN TOTAL: 0.7 mg/dL (ref 0.3–1.2)
BUN: <5 mg/dL — ABNORMAL LOW (ref 6–20)
CALCIUM: 8 mg/dL — AB (ref 8.9–10.3)
CO2: 21 mmol/L — ABNORMAL LOW (ref 22–32)
CREATININE: 0.77 mg/dL (ref 0.61–1.24)
Chloride: 100 mmol/L — ABNORMAL LOW (ref 101–111)
Glucose, Bld: 89 mg/dL (ref 65–99)
Potassium: 3.4 mmol/L — ABNORMAL LOW (ref 3.5–5.1)
Sodium: 129 mmol/L — ABNORMAL LOW (ref 135–145)
TOTAL PROTEIN: 7.1 g/dL (ref 6.5–8.1)

## 2015-09-04 LAB — ETHANOL: Alcohol, Ethyl (B): 216 mg/dL — ABNORMAL HIGH (ref <5)

## 2015-09-04 LAB — ACETAMINOPHEN LEVEL

## 2015-09-04 LAB — SALICYLATE LEVEL

## 2015-09-04 MED ORDER — LORAZEPAM 2 MG PO TABS
2.0000 mg | ORAL_TABLET | Freq: Once | ORAL | Status: AC
  Administered 2015-09-04: 2 mg via ORAL
  Filled 2015-09-04: qty 1

## 2015-09-04 MED ORDER — LORAZEPAM 1 MG PO TABS
1.0000 mg | ORAL_TABLET | Freq: Once | ORAL | Status: AC
  Administered 2015-09-04: 1 mg via ORAL
  Filled 2015-09-04: qty 1
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
## 2015-09-04 NOTE — Consult Note (Signed)
[facility] Consult   Reason for Consult:  Consult for this 60 year old man who got brought into the hospital after calling mobile crisis and reporting suicidal ideation Referring Physician:  Quale Patient Identification: MALICK NETZ MRN:  [id number] Principal Diagnosis: Substance induced mood disorder ([place]) Diagnosis:   Patient Active Problem List   Diagnosis Date Noted  . Substance induced mood disorder ([place]) [F19.94] 09/04/2015  . Alcohol abuse [F10.10] 09/04/2015  . Suicidal ideation [R45.851] 09/04/2015  . Posttraumatic stress disorder [F43.10] 09/04/2015  . Chronic pain [G89.29] 09/04/2015  . Opiate withdrawal [facility]) [F11.23] 09/04/2015    Total Time spent with patient: 1 hour  Subjective:   TILDEN BROZ is a 60 y.o. male patient admitted with "I just needed someone to talk to".  HPI:  Patient interviewed. Chart reviewed. Old notes reviewed. Labs reviewed and case discussed with emergency room physician. Patient called mobile crisis yesterday because he was feeling upset and wanted to talk to someone. Apparently he made some statements about suicidality resulting in his being petitioned and brought into the hospital by law enforcement. When he first arrived she was reporting suicidal ideation. Patient was intoxicated on arrival. He is now sober and able to give a somewhat better history. He reports that he has chronic depression this been going on for years but his mood has been worse recently especially in the last week since he ran out of his narcotic pain medicine. Sleep is poor. Physically feeling poor. Chronic back pain which is worse off his medicine. Some symptoms of opiate withdrawal. Additionally he admits that he drinks alcohol frequently usually 5 or 6 beers a day. Patient now denies to me that he has any intention or thought of killing himself. He denies he's having any psychotic symptoms. He admits that he has been noncompliant with prescribed medicine for  his mental health condition. He is diagnosed with posttraumatic stress disorder apparently and treated at the [facility] in [place] he doesn't think the medicine was working. He is upset because they are cutting him off of his chronic narcotic medicine reportedly because he had a drug screen positive for marijuana.  Social history: Patient lives by himself but he has an adult daughter who comes and takes care of all of his needs at home. Does not work. Barely leaves the house. Pretty impoverished social life. Follow-up care at the [facility]  Medical history: Patient reports that he has multiple medical problems but doesn't want to go and all the details of it. Sounds again has a history of chronic pain at least and also reportedly posttraumatic stress disorder. He says he takes a big handful of pills every day but he can't remember any of them and isn't sure what all he is compliant with.  Substance abuse history: Long history of alcohol abuse. Longest period of sobriety was during incarceration. Claims he sat up to a year of sobriety when he wasn't incarcerated before. Seems to have only vague commitment to stopping now. History of frequent use of marijuana. Patient claims that he doesn't abuse his prescription narcotics.  Past Psychiatric History: Patient is had prior admissions to hospitals both [place] and state hospitals in the past. He says he tried to kill himself when he was a teenager but hasn't done anything like that since then. He's carries a diagnosis of PTSD. Gets followed up at the [facility]. Santiago Glad remember any medicines he has taken.  Risk to Self: Suicidal Ideation: No-Not Currently/Within Last 6 Months Suicidal  Intent: No Is patient at risk for suicide?: Yes Suicidal Plan?: No-Not Currently/Within Last 6 Months Access to Means: No What has been your use of drugs/alcohol within the last 12 months?: alcohol, THC,  How many times?: 0 Intentional Self Injurious Behavior: None Risk to  Others: Homicidal Ideation: No-Not Currently/Within Last 6 Months Thoughts of Harm to Others: No-Not Currently Present/Within Last 6 Months Current Homicidal Intent: No-Not Currently/Within Last 6 Months Current Homicidal Plan: No-Not Currently/Within Last 6 Months Access to Homicidal Means: No History of harm to others?: Yes Assessment of Violence: In past 6-12 months Violent Behavior Description: assault charges pending Does patient have access to weapons?: No Criminal Charges Pending?: Yes Describe Pending Criminal Charges: assualt charges pending Does patient have a court date: No Prior Inpatient Therapy: Prior Inpatient Therapy: Yes Prior Therapy Dates: unknown [facility] Reason for Treatment: [facility] Prior Outpatient Therapy: Prior Outpatient Therapy: Yes Prior Therapy Dates: unknown Prior Therapy Facilty/Provider(s): [facility] Reason for Treatment: [facility] Does patient have an ACCT team?: No Does patient have Intensive In-House Services?  : No Does patient have [facility] services? : No Does patient have P4CC services?: No  Past Medical History:  Past Medical History  Diagnosis Date  . Coronary artery disease   . Hypertension   . Thyroid disease   . Stroke ([place])   . Colonic fistula   . Hep C w/o coma, chronic (HCC)     Past Surgical History  Procedure Laterality Date  . Colon surgery     Family History: History reviewed. No pertinent family history. Family Psychiatric  History: Patient says that multiple members of his family had drug and alcohol problems denies knowing of any other mental health problems in the family Social History:  History  Alcohol Use  . Yes     History  Drug Use No    Social History   Social History  . Marital Status: Single    Spouse Name: N/A  . Number of Children: N/A  . Years of Education: N/A   Social History Main Topics  . Smoking status: Current Every Day Smoker    Types: Cigarettes  . Smokeless  tobacco: None  . Alcohol Use: Yes  . Drug Use: No  . Sexual Activity: Not Asked   Other Topics Concern  . None   Social History Narrative   Additional Social History:    Pain Medications: see chart Prescriptions: see chart Over the Counter: see chart History of alcohol / drug use?: Yes Longest period of sobriety (when/how long): Unclear Negative Consequences of Use: Financial, Legal, Personal relationships, Work / School Withdrawal Symptoms: Diarrhea, Sweats, Agitation, Aggressive/Assaultive, Tingling, Irritability, Tremors, Nausea / Vomiting, Weakness, Patient aware of relationship between substance abuse and physical/medical complications, Cramps Name of Substance 1: Alcohol 1 - Age of First Use: 10 1 - Amount (size/oz): 5-6  1 - Frequency: daily 1 - Duration: ongoing 1 - Last Use / Amount: 1/12 Name of Substance 2: THC 2 - Age of First Use: 17 2 - Amount (size/oz): varies 2 - Frequency: daily 2 - Duration: ongoing 2 - Last Use / Amount: 5 days ago Name of Substance 3: opiates/pain clinic 3 - Last Use / Amount: week ago               Allergies:   Allergies  Allergen Reactions  . Codeine Hives    Labs:  Results for orders placed or performed during the hospital encounter of 09/04/15 (from the past 48 hour(s))  Comprehensive metabolic panel     Status: Abnormal   Collection Time: 09/04/15  6:49 AM  Result Value Ref Range   Sodium 129 (L) 135 - 145 mmol/L   Potassium 3.4 (L) 3.5 - 5.1 mmol/L   Chloride 100 (L) 101 - 111 mmol/L   CO2 21 (L) 22 - 32 mmol/L   Glucose, Bld 89 65 - 99 mg/dL   BUN <5 (L) 6 - 20 mg/dL   Creatinine, Ser 0.77 0.61 - 1.24 mg/dL   Calcium 8.0 (L) 8.9 - 10.3 mg/dL   Total Protein 7.1 6.5 - 8.1 g/dL   Albumin 3.1 (L) 3.5 - 5.0 g/dL   AST 176 (H) 15 - 41 U/L   ALT 107 (H) 17 - 63 U/L   Alkaline Phosphatase 69 38 - 126 U/L   Total Bilirubin 0.7 0.3 - 1.2 mg/dL   GFR calc non Af Amer >60 >60 mL/min   GFR calc Af Amer >60 >60 mL/min     Comment: (NOTE) The eGFR has been calculated using the CKD EPI equation. This calculation has not been validated in all clinical situations. eGFR's persistently <60 mL/min signify possible Chronic Kidney Disease.    Anion gap 8 5 - 15  Ethanol     Status: Abnormal   Collection Time: 09/04/15  6:49 AM  Result Value Ref Range   Alcohol, Ethyl (B) 216 (H) <5 mg/dL    Comment:        LOWEST DETECTABLE LIMIT FOR SERUM ALCOHOL IS 5 mg/dL FOR MEDICAL PURPOSES ONLY   CBC with Diff     Status: Abnormal   Collection Time: 09/04/15  6:49 AM  Result Value Ref Range   WBC 6.4 3.8 - 10.6 K/uL   RBC 4.55 4.40 - 5.90 MIL/uL   Hemoglobin 14.8 13.0 - 18.0 g/dL   HCT 43.0 40.0 - 52.0 %   MCV 94.5 80.0 - 100.0 fL   MCH 32.6 26.0 - 34.0 pg   MCHC 34.5 32.0 - 36.0 g/dL   RDW 15.6 (H) 11.5 - 14.5 %   Platelets 163 150 - 440 K/uL   Neutrophils Relative % 45 %   Neutro Abs 2.9 1.4 - 6.5 K/uL   Lymphocytes Relative 41 %   Lymphs Abs 2.7 1.0 - 3.6 K/uL   Monocytes Relative 10 %   Monocytes Absolute 0.6 0.2 - 1.0 K/uL   Eosinophils Relative 3 %   Eosinophils Absolute 0.2 0 - 0.7 K/uL   Basophils Relative 1 %   Basophils Absolute 0.0 0 - 0.1 K/uL  Acetaminophen level     Status: Abnormal   Collection Time: 09/04/15  6:49 AM  Result Value Ref Range   Acetaminophen (Tylenol), Serum <10 (L) 10 - 30 ug/mL    Comment:        THERAPEUTIC CONCENTRATIONS VARY SIGNIFICANTLY. A RANGE OF 10-30 ug/mL MAY BE AN EFFECTIVE CONCENTRATION FOR MANY PATIENTS. HOWEVER, SOME ARE BEST TREATED AT CONCENTRATIONS OUTSIDE THIS RANGE. ACETAMINOPHEN CONCENTRATIONS >150 ug/mL AT 4 HOURS AFTER INGESTION AND >50 ug/mL AT 12 HOURS AFTER INGESTION ARE OFTEN ASSOCIATED WITH TOXIC REACTIONS.   Salicylate level     Status: None   Collection Time: 09/04/15  6:49 AM  Result Value Ref Range   Salicylate Lvl <0.2 2.8 - 30.0 mg/dL  Urine Drug Screen, Qualitative ([facility] only)     Status: None   Collection Time: 09/04/15  7:24  AM  Result Value Ref Range   Tricyclic, Ur Screen NONE DETECTED NONE DETECTED  Amphetamines, Ur Screen NONE DETECTED NONE DETECTED   MDMA (Ecstasy)Ur Screen NONE DETECTED NONE DETECTED   Cocaine Metabolite,Ur [place] NONE DETECTED NONE DETECTED   Opiate, Ur Screen NONE DETECTED NONE DETECTED   Phencyclidine (PCP) Ur S NONE DETECTED NONE DETECTED   Cannabinoid 50 Ng, Ur  NONE DETECTED NONE DETECTED   Barbiturates, Ur Screen NONE DETECTED NONE DETECTED   Benzodiazepine, Ur Scrn NONE DETECTED NONE DETECTED   Methadone Scn, Ur NONE DETECTED NONE DETECTED    Comment: (NOTE) 269  Tricyclics, urine               Cutoff 1000 ng/mL 200  Amphetamines, urine             Cutoff 1000 ng/mL 300  MDMA (Ecstasy), urine           Cutoff 500 ng/mL 400  Cocaine Metabolite, urine       Cutoff 300 ng/mL 500  Opiate, urine                   Cutoff 300 ng/mL 600  Phencyclidine (PCP), urine      Cutoff 25 ng/mL 700  Cannabinoid, urine              Cutoff 50 ng/mL 800  Barbiturates, urine             Cutoff 200 ng/mL 900  Benzodiazepine, urine           Cutoff 200 ng/mL 1000 Methadone, urine                Cutoff 300 ng/mL 1100 1200 The urine drug screen provides only a preliminary, unconfirmed 1300 analytical test result and should not be used for non-medical 1400 purposes. Clinical consideration and professional judgment should 1500 be applied to any positive drug screen result due to possible 1600 interfering substances. A more specific alternate chemical method 1700 must be used in order to obtain a confirmed analytical result.  1800 Gas chromato graphy / mass spectrometry (GC/MS) is the preferred 1900 confirmatory method.     No current facility-administered medications for this encounter.   No current outpatient prescriptions on file.    Musculoskeletal: Strength & Muscle Tone: decreased Gait & Station: ataxic Patient leans: N/A  Psychiatric Specialty Exam: Review of Systems   Constitutional: Positive for malaise/fatigue.  HENT: Negative.   Eyes: Negative.   Respiratory: Negative.   Cardiovascular: Negative.   Gastrointestinal: Negative.   Musculoskeletal: Positive for back pain.  Skin: Negative.   Neurological: Positive for weakness.  Psychiatric/Behavioral: Positive for depression and substance abuse. Negative for suicidal ideas, hallucinations and memory loss. The patient is nervous/anxious and has insomnia.     Blood pressure 141/81, pulse 78, temperature 98.7 F (37.1 C), temperature source Oral, resp. rate 18, height '5\' 8"'  (1.727 m), weight 82.555 kg (182 lb), SpO2 99 %.Body mass index is 27.68 kg/(m^2).  General Appearance: Disheveled  Eye Contact::  Minimal  Speech:  Slow  Volume:  Decreased  Mood:  Dysphoric  Affect:  Constricted  Thought Process:  Goal Directed  Orientation:  Full (Time, Place, and Person)  Thought Content:  Negative  Suicidal Thoughts:  No  Homicidal Thoughts:  No  Memory:  Immediate;   Good Recent;   Poor Remote;   Poor  Judgement:  Impaired  Insight:  Shallow  Psychomotor Activity:  Psychomotor Retardation  Concentration:  Poor  Recall:  Poor  Fund of Knowledge:Fair  Language: Fair  Akathisia:  No  Handed:  Right  AIMS (if indicated):     Assets:  Financial Resources/Insurance Housing Social Support  ADL's:  Intact  Cognition: Impaired,  Mild  Sleep:      Treatment Plan Summary: Plan 60 year old man presented with suicidal ideation. Suicidal ideation appears to be resolved issue at this point. He has not tried to harm himself and he has consistently stated ever since sobering up that he is not suicidal and there is no evidence that he was trying to kill himself. Substance induced mood symptoms also appear to be resolving as he is no longer intoxicated. Patient has a history of alcohol abuse and has been counseled about physical and psychological consequences of alcohol abuse and strongly encouraged follow-up at  the [place] to stop substance abuse. He is strongly encouraged follow-up at the [facility] for further treatment of posttraumatic stress disorder. Has chronic anxiety and depression possibly related to PTSD. Does not need any hospital level intervention at this point. Encouraged continue his outpatient medicine and talked with provider as soon as possible. He will follow-up at the [place] next Wednesday. Case discussed with ER doctor. No longer committable. Patient off commitment and can be discharged.  Disposition: Patient does not meet criteria for psychiatric inpatient admission. Supportive therapy provided about ongoing stressors.  Breelynn Bankert 09/04/2015 1:55 PM

## 2015-09-04 NOTE — ED Notes (Signed)
Awake, Alert, Oriented x 3.  Skin warm and dry.  Calm and cooperative.  Discharged to lobby.  Patient's daughter to pick patient up at 1600.  Discharge instructions discussed with patient and daughter (by phone).  Understanding verbalized. Patient did not want to wait in ED for daughter and requested to be discharged to wait for her in the lobby. Discharge home.

## 2015-09-04 NOTE — ED Notes (Signed)
Pt sitting up on stretcher talking with telemed intake staff at this time.

## 2015-09-04 NOTE — ED Notes (Signed)
Pt sitting up eating breakfast.

## 2015-09-04 NOTE — ED Notes (Signed)
Spoke with Renie OraLaura Crispo , pts daughter on phone.

## 2015-09-04 NOTE — ED Notes (Signed)
Dr.Clapacs at bedside  

## 2015-09-04 NOTE — ED Notes (Signed)
Pt arrived to the ED accompanied by [employer] under IVC for suicidal thoughts. Pt states that he is a chronic pain Pt and recently was told that he will not be receiving any refills of his pain medication because he tested positive for marijuana. Pt states that without his pain medication he can not do anything around the house and if he can not do anything what is the point of living. Pt denies SI or HI in triage. Pt has a colostomy bag and a hernia around it.

## 2015-09-04 NOTE — BH Assessment (Signed)
Tele Assessment Note   [name] Hunter is an 60 y.o. male. Patient was brought in to the ED under IVC because of depression and suicidal thoughts.  Patient reports he just needed someone to talk to last night because of severe withdrawal symptoms from missing his pain management for the past week.Patient reports current withdrawal are shakes, nausea, unable to eat, and body aches.  Patient reported suicidal thoughts with no plan on admission, denies homicidal thoughts, denies A/VH, and other self-injurious behaviors.  Patient reports a history of severe flashbacks and war related nightmares associated with his diagnosis of PTSD.  His is currently participating with [facility] and will be seen in the coming weeks for SA groups.  Patient reports his psychiatrist is Dr. Katrinka Blazing.   Patient currently has 2 legal charges(Assault with deadly weapon and DUI) that his lawyer will establish court dates.  Patient reports that he spoke with his daughter and she is willing to follow up with him today if he is discharged.  Patient reports he is safe at home and willing to follow up with outpatient services.    This Clinical research associate consulted with Dr. Toni Amend and is pending disposition.  Diagnosis: PTSD, Alcohol use, moderate; Cannabis use, moderate  Past Medical History:  Past Medical History  Diagnosis Date  . Coronary artery disease   . Hypertension   . Thyroid disease   . Stroke (HCC)   . Colonic fistula   . Hep C w/o coma, chronic (HCC)     Past Surgical History  Procedure Laterality Date  . Colon surgery      Family History: History reviewed. No pertinent family history.  Social History:  reports that he has been smoking Cigarettes.  He does not have any smokeless tobacco history on file. He reports that he drinks alcohol. He reports that he does not use illicit drugs.  Additional Social History:  Alcohol / Drug Use Pain Medications: see chart Prescriptions: see chart Over the Counter: see chart History  of alcohol / drug use?: Yes Longest period of sobriety (when/how long): Unclear Negative Consequences of Use: Financial, Legal, Personal relationships, Work / School Withdrawal Symptoms: Diarrhea, Sweats, Agitation, Aggressive/Assaultive, Tingling, Irritability, Tremors, Nausea / Vomiting, Weakness, Patient aware of relationship between substance abuse and physical/medical complications, Cramps Substance #1 Name of Substance 1: Alcohol 1 - Age of First Use: 10 1 - Amount (size/oz): 5-6  1 - Frequency: daily 1 - Duration: ongoing 1 - Last Use / Amount: 1/12 Substance #2 Name of Substance 2: THC 2 - Age of First Use: 17 2 - Amount (size/oz): varies 2 - Frequency: daily 2 - Duration: ongoing 2 - Last Use / Amount: 5 days ago Substance #3 Name of Substance 3: opiates/pain clinic 3 - Last Use / Amount: week ago  CIWA: CIWA-Ar BP: (!) 141/81 mmHg Pulse Rate: 78 COWS: Clinical Opiate Withdrawal Scale (COWS) Resting Pulse Rate: Pulse Rate 81-100 Sweating: Subjective report of chills or flushing Restlessness: Reports difficulty sitting still, but is able to do so Pupil Size: Pupils pinned or normal size for room light Bone or Joint Aches: Patient reports sever diffuse aching of joints/muscles Runny Nose or Tearing: Not present GI Upset: nausea or loose stool Tremor: Tremor can be felt, but not observed Yawning: No yawning Anxiety or Irritability: Patient reports increasing irritability or anxiousness Gooseflesh Skin: Skin is smooth COWS Total Score: 9  PATIENT STRENGTHS: (choose at least two) Capable of independent living Communication skills Supportive family/friends  Allergies:  Allergies  Allergen Reactions  . Codeine Hives    Home Medications:  (Not in a hospital admission)  OB/GYN Status:  No LMP for male patient.  General Assessment Data [facility] Assessment: In system Is this a Tele or Face-to-Face Assessment?: Tele Assessment Is this  an Initial Assessment or a Re-assessment for this encounter?: Initial Assessment Marital status: Divorced [place] na Is patient pregnant?: No Pregnancy Status: No Living Arrangements: Parent Can pt return to current living arrangement?: Yes Admission Status: Voluntary Is patient capable of signing voluntary admission?: Yes Referral Source: Self/Family/Friend Insurance type: MCR/[facility]  Medical Screening Exam [facility] Walk-[facility] ONLY) Medical Exam completed: Yes  Crisis Care Plan Living Arrangements: Parent Name of Psychiatrist: [facility] Name of Therapist: [facility]  Education Status Is patient currently in school?: No Current Grade: 12 Highest grade of school patient has completed: 12 Contact person: na  Risk to self with the past 6 months Suicidal Ideation: No-Not Currently/Within Last 6 Months Has patient been a risk to self within the past 6 months prior to admission? : Yes Suicidal Intent: No Has patient had any suicidal intent within the past 6 months prior to admission? : No Is patient at risk for suicide?: Yes Suicidal Plan?: No-Not Currently/Within Last 6 Months Has patient had any suicidal plan within the past 6 months prior to admission? : No Access to Means: No What has been your use of drugs/alcohol within the last 12 months?: alcohol, THC,  Previous Attempts/Gestures: No How many times?: 0 Intentional Self Injurious Behavior: None Family Suicide History: No Recent stressful life event(s): Loss (Comment), Financial Problems, Legal Issues, Other (Comment) (pain management stopped. ) Persecutory voices/beliefs?: No Depression: Yes Depression Symptoms: Guilt, Fatigue, Loss of interest in usual pleasures, Feeling angry/irritable, Feeling worthless/self pity Substance abuse history and/or treatment for substance abuse?: Yes  Risk to Others within the past 6 months Homicidal Ideation: No-Not Currently/Within Last 6 Months Does patient have any lifetime risk of  violence toward others beyond the six months prior to admission? : Yes (comment) Thoughts of Harm to Others: No-Not Currently Present/Within Last 6 Months Current Homicidal Intent: No-Not Currently/Within Last 6 Months Current Homicidal Plan: No-Not Currently/Within Last 6 Months Access to Homicidal Means: No History of harm to others?: Yes Assessment of Violence: In past 6-12 months Violent Behavior Description: assault charges pending Does patient have access to weapons?: No Criminal Charges Pending?: Yes Describe Pending Criminal Charges: assualt charges pending Does patient have a court date: No Is patient on probation?: No  Psychosis Hallucinations: None noted Delusions: None noted  Mental Status Report Appearance/Hygiene: In hospital gown Eye Contact: Poor Motor Activity: Freedom of movement Speech: Logical/coherent Level of Consciousness: Alert Mood: Anxious Affect: Anxious Anxiety Level: Minimal Thought Processes: Coherent Judgement: Unimpaired Obsessive Compulsive Thoughts/Behaviors: None  Cognitive Functioning Concentration: Fair Memory: Recent Intact, Remote Intact IQ: Average Insight: Fair Impulse Control: Fair Appetite: Fair Weight Loss: 0 Weight Gain: 0 Sleep: No Change Total Hours of Sleep: 5 Vegetative Symptoms: None  ADLScreening [facility] Assessment Services) Patient's cognitive ability adequate to safely complete daily activities?: Yes Patient able to express need for assistance with ADLs?: Yes Independently performs ADLs?: Yes (appropriate for developmental age)  Prior Inpatient Therapy Prior Inpatient Therapy: Yes Prior Therapy Dates: unknown [facility]: SA/[facility]  Prior Outpatient Therapy Prior Outpatient Therapy: Yes Prior Therapy Dates: unknown Prior Therapy Facilty/Provider(s): [facility] Reason for Treatment: SA/MH Does patient have an ACCT team?: No Does patient have Intensive  In-House  Services?  : No Does patient have [facility] services? : No Does patient have P4CC services?: No  ADL Screening (condition at time of admission) Patient's cognitive ability adequate to safely complete daily activities?: Yes Patient able to express need for assistance with ADLs?: Yes Independently performs ADLs?: Yes (appropriate for developmental age)       Abuse/Neglect Assessment (Assessment to be complete while patient is alone) Physical Abuse: Denies Verbal Abuse: Denies Sexual Abuse: Denies Exploitation of patient/patient's resources: Denies Self-Neglect: Denies Values / Beliefs Cultural Requests During Hospitalization: None Spiritual Requests During Hospitalization: None Consults Spiritual Care Consult Needed: No Social Work Consult Needed: No Merchant navy officer (For Healthcare) Does patient have an advance directive?: No Would patient like information on creating an advanced directive?: No - patient declined information    Additional Information 1:1 In Past 12 Months?: No CIRT Risk: No Elopement Risk: No Does patient have medical clearance?: Yes     Disposition:  Disposition Initial Assessment Completed for this Encounter: Yes Disposition of Patient: Other dispositions (Pending) Other disposition(s): Other (Comment) (Pending)  Maryelizabeth Rowan A 09/04/2015 1:37 PM

## 2015-09-04 NOTE — Discharge Instructions (Signed)
You have been seen in the Emergency Department (ED) today for a psychiatric complaint.  You have been evaluated by psychiatry and we believe you are safe to be discharged from the hospital.    Please return to the ED immediately if you have ANY thoughts of hurting yourself or anyone else, so that we may help you.  Please avoid alcohol and drug use.  Follow up with your doctor and/or therapist as soon as possible regarding today's ED visit.   Please follow up any other recommendations and clinic appointments provided by the psychiatry team that saw you in the Emergency Department.   Major Depressive Disorder Major depressive disorder is a mental illness. It also may be called clinical depression or unipolar depression. Major depressive disorder usually causes feelings of sadness, hopelessness, or helplessness. Some people with this disorder do not feel particularly sad but lose interest in doing things they used to enjoy (anhedonia). Major depressive disorder also can cause physical symptoms. It can interfere with work, school, relationships, and other normal everyday activities. The disorder varies in severity but is longer lasting and more serious than the sadness we all feel from time to time in our lives. Major depressive disorder often is triggered by stressful life events or major life changes. Examples of these triggers include divorce, loss of your job or home, a move, and the death of a family member or close friend. Sometimes this disorder occurs for no obvious reason at all. People who have family members with major depressive disorder or bipolar disorder are at higher risk for developing this disorder, with or without life stressors. Major depressive disorder can occur at any age. It may occur just once in your life (single episode major depressive disorder). It may occur multiple times (recurrent major depressive disorder). SYMPTOMS People with major depressive disorder have either anhedonia  or depressed mood on nearly a daily basis for at least 2 weeks or longer. Symptoms of depressed mood include:  Feelings of sadness (blue or down in the dumps) or emptiness.  Feelings of hopelessness or helplessness.  Tearfulness or episodes of crying (may be observed by others).  Irritability (children and adolescents). In addition to depressed mood or anhedonia or both, people with this disorder have at least four of the following symptoms:  Difficulty sleeping or sleeping too much.   Significant change (increase or decrease) in appetite or weight.   Lack of energy or motivation.  Feelings of guilt and worthlessness.   Difficulty concentrating, remembering, or making decisions.  Unusually slow movement (psychomotor retardation) or restlessness (as observed by others).   Recurrent wishes for death, recurrent thoughts of self-harm (suicide), or a suicide attempt. People with major depressive disorder commonly have persistent negative thoughts about themselves, other people, and the world. People with severe major depressive disorder may experiencedistorted beliefs or perceptions about the world (psychotic delusions). They also may see or hear things that are not real (psychotic hallucinations). DIAGNOSIS Major depressive disorder is diagnosed through an assessment by your health care provider. Your health care provider will ask aboutaspects of your daily life, such as mood,sleep, and appetite, to see if you have the diagnostic symptoms of major depressive disorder. Your health care provider may ask about your medical history and use of alcohol or drugs, including prescription medicines. Your health care provider also may do a physical exam and blood work. This is because certain medical conditions and the use of certain substances can cause major depressive disorder-like symptoms (secondary depression). Your health  care provider also may refer you to a mental health specialist for  further evaluation and treatment. °TREATMENT °It is important to recognize the symptoms of major depressive disorder and seek treatment. The following treatments can be prescribed for this disorder:   °· Medicine. Antidepressant medicines usually are prescribed. Antidepressant medicines are thought to correct chemical imbalances in the brain that are commonly associated with major depressive disorder. Other types of medicine may be added if the symptoms do not respond to antidepressant medicines alone or if psychotic delusions or hallucinations occur. °· Talk therapy. Talk therapy can be helpful in treating major depressive disorder by providing support, education, and guidance. Certain types of talk therapy also can help with negative thinking (cognitive behavioral therapy) and with relationship issues that trigger this disorder (interpersonal therapy). °A mental health specialist can help determine which treatment is best for you. Most people with major depressive disorder do well with a combination of medicine and talk therapy. Treatments involving electrical stimulation of the brain can be used in situations with extremely severe symptoms or when medicine and talk therapy do not work over time. These treatments include electroconvulsive therapy, transcranial magnetic stimulation, and vagal nerve stimulation. °  °This information is not intended to replace advice given to you by your health care provider. Make sure you discuss any questions you have with your health care provider. °  °Document Released: 12/03/2012 Document Revised: 08/29/2014 Document Reviewed: 12/03/2012 °Elsevier Interactive Patient Education ©2016 Elsevier Inc. ° °

## 2015-09-04 NOTE — ED Provider Notes (Signed)
Patient awake alert no distress. Understand[facility] the [facility]. He was seen and cleared for discharge with IVC rescinded by Dr. Toni Amendlapacs. Patient in no distress.  Has close follow-up with the [facility], whom he will establish a close follow-up appointment with.  Sharyn CreamerMark Abir Craine, MD 09/04/15 [id number]

## 2016-02-08 IMAGING — CR DG CHEST 1V PORT
1 series · 1 of 1 positions shown · non-contrast
Comparison: Chest radiograph performed 02/28/2013

CLINICAL DATA: Acute onset of left-sided chest pain, with cough,
shortness of breath and dizziness. Initial encounter.

EXAM:
PORTABLE CHEST - 1 VIEW

[ap]
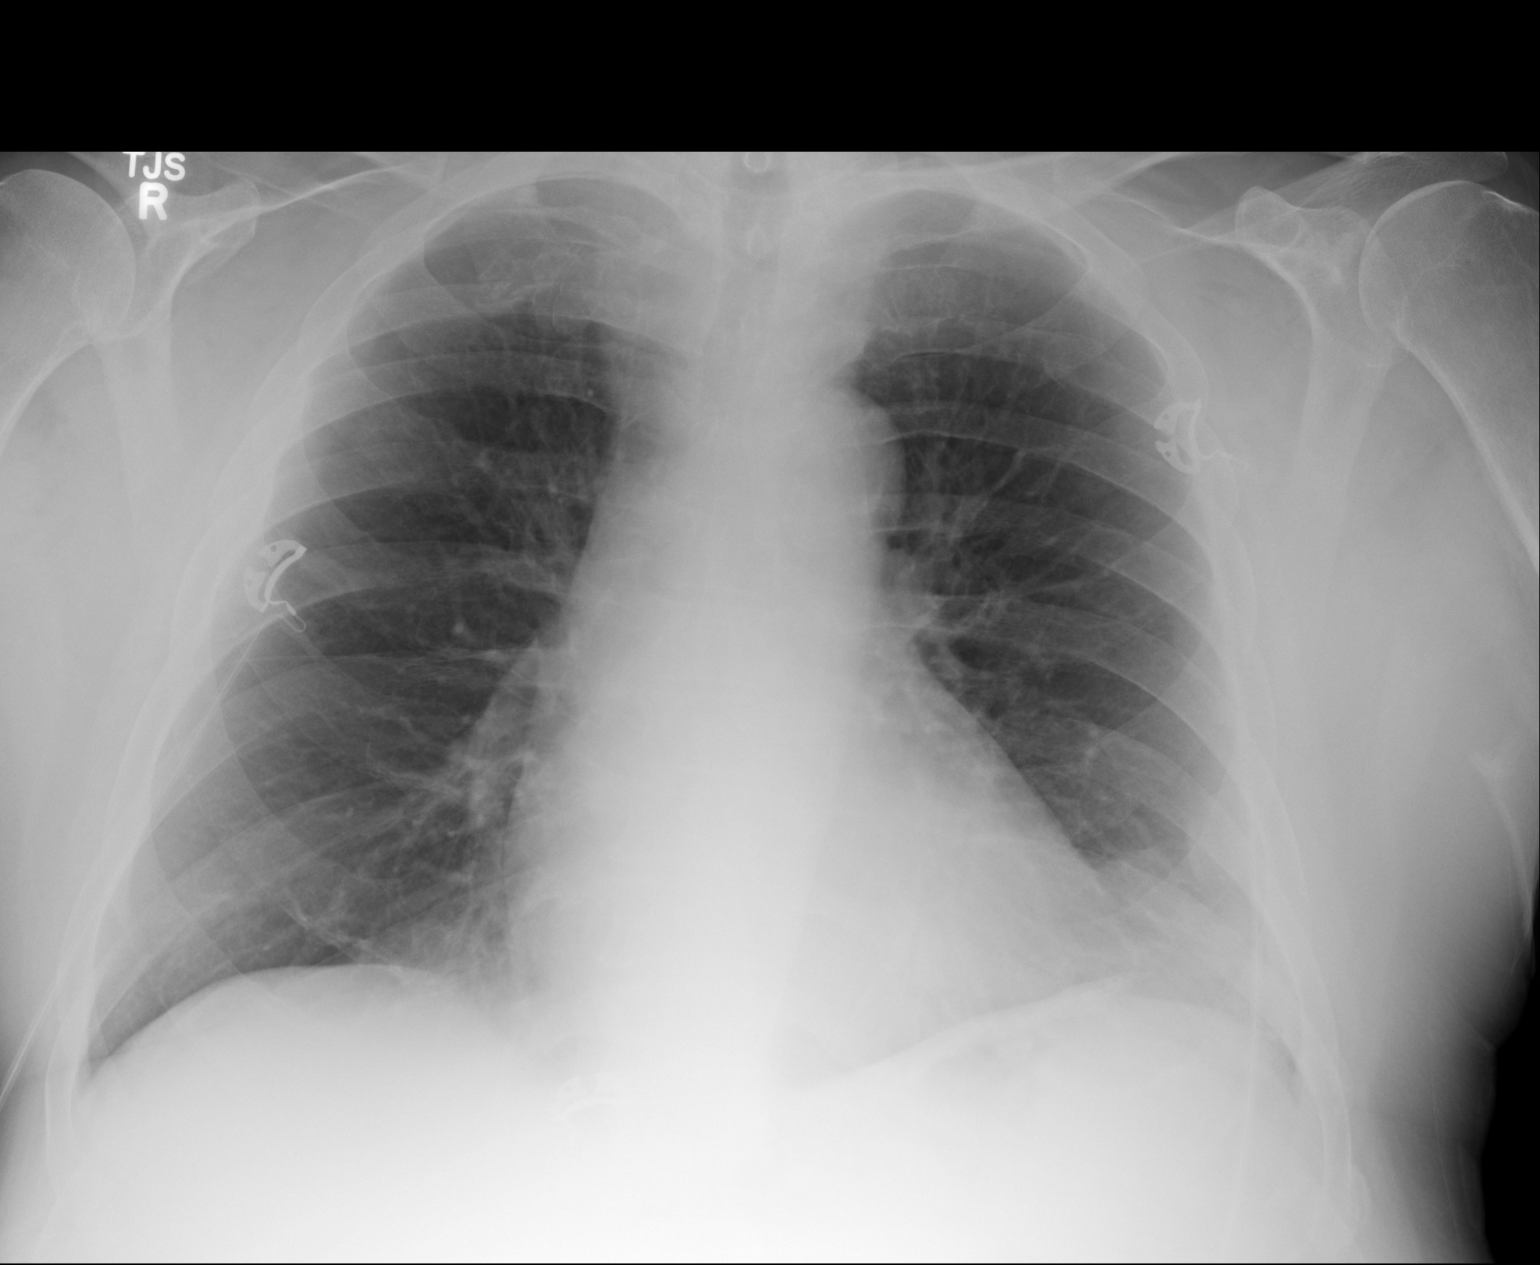

[1 of 1 positions shown; findings below may reference images not displayed]

FINDINGS: The lungs are well-aerated and clear. There is no evidence of focal
opacification, pleural effusion or pneumothorax.

The cardiomediastinal silhouette is within normal limits. No acute
osseous abnormalities are seen.
IMPRESSION: No acute cardiopulmonary process seen.

## 2016-06-09 ENCOUNTER — Emergency Department
Admission: EM | Admit: 2016-06-09 | Discharge: 2016-06-22 | Disposition: E | Payer: Medicare Other | Attending: Emergency Medicine | Admitting: Emergency Medicine

## 2016-06-09 DIAGNOSIS — I469 Cardiac arrest, cause unspecified: Secondary | ICD-10-CM | POA: Diagnosis present

## 2016-06-09 DIAGNOSIS — F17210 Nicotine dependence, cigarettes, uncomplicated: Secondary | ICD-10-CM | POA: Diagnosis not present

## 2016-06-09 DIAGNOSIS — T401X4A Poisoning by heroin, undetermined, initial encounter: Secondary | ICD-10-CM | POA: Insufficient documentation

## 2016-06-09 DIAGNOSIS — I2510 Atherosclerotic heart disease of native coronary artery without angina pectoris: Secondary | ICD-10-CM | POA: Diagnosis not present

## 2016-06-09 DIAGNOSIS — I10 Essential (primary) hypertension: Secondary | ICD-10-CM | POA: Insufficient documentation

## 2016-06-09 DIAGNOSIS — T50904A Poisoning by unspecified drugs, medicaments and biological substances, undetermined, initial encounter: Secondary | ICD-10-CM

## 2016-06-09 LAB — GLUCOSE, CAPILLARY: GLUCOSE-CAPILLARY: 121 mg/dL — AB (ref 65–99)

## 2016-06-09 MED ORDER — EPINEPHRINE PF 1 MG/10ML IJ SOSY
PREFILLED_SYRINGE | INTRAMUSCULAR | Status: AC | PRN
  Administered 2016-06-09 (×4): 1 via INTRAVENOUS

## 2016-06-09 MED ORDER — SODIUM CHLORIDE 0.9 % IV SOLN
INTRAVENOUS | Status: AC | PRN
  Administered 2016-06-09: 1000 mL via INTRAVENOUS

## 2016-06-09 MED ORDER — NALOXONE HCL 2 MG/2ML IJ SOSY
PREFILLED_SYRINGE | INTRAMUSCULAR | Status: AC | PRN
  Administered 2016-06-09: 2 mg via INTRAVENOUS

## 2016-06-09 MED ORDER — SODIUM BICARBONATE 8.4 % IV SOLN
INTRAVENOUS | Status: AC | PRN
  Administered 2016-06-09: 50 meq via INTRAVENOUS

## 2016-06-09 MED ORDER — ATROPINE SULFATE 1 MG/ML IJ SOLN
INTRAMUSCULAR | Status: AC | PRN
  Administered 2016-06-09 (×3): 1 mg via INTRAVENOUS

## 2016-06-12 MED FILL — Medication: Qty: 1 | Status: AC

## 2016-06-22 DIAGNOSIS — 419620001 Death: Secondary | SNOMED CT | POA: Diagnosis not present

## 2016-06-22 NOTE — ED Provider Notes (Signed)
[facility] Emergency Department Provider Note     L5 caveat: Review of systems and history is unable to be obtained, patient is status post arrest   Time seen: ----------------------------------------- 3:35 PM on 07/09/2016 -----------------------------------------    I have reviewed the triage vital signs and the nursing notes.   HISTORY  Chief Complaint No chief complaint on file.    HPI [name] Hunter is a 60 y.o. male who presents to the ER status post cardiac arrest. EMS reports the patient and a friend were using heroin. EMS was called out for an overdose and patient subsequently arrested. EMS obtained access via IO, gave multiple rounds of epinephrine and Narcan without return of spontaneous circulation. He was intubated prehospital with a King airway. No further information or report is available.   Past Medical History:  Diagnosis Date  . Colonic fistula   . Coronary artery disease   . Hep C w/o coma, chronic (HCC)   . Hypertension   . Stroke (HCC)   . Thyroid disease     Patient Active Problem List   Diagnosis Date Noted  . Substance induced mood disorder (HCC) 09/04/2015  . Alcohol abuse 09/04/2015  . Suicidal ideation 09/04/2015  . Posttraumatic stress disorder 09/04/2015  . Chronic pain 09/04/2015  . Opiate withdrawal (HCC) 09/04/2015    Past Surgical History:  Procedure Laterality Date  . COLON SURGERY      Allergies Codeine  Social History Social History  Substance Use Topics  . Smoking status: Current Every Day Smoker    Types: Cigarettes  . Smokeless tobacco: Not on file  . Alcohol use Yes    Review of Systems Unknown at this time  ____________________________________________   PHYSICAL EXAM:  VITAL SIGNS: ED Triage Vitals  Enc Vitals Group     BP      Pulse      Resp      Temp      Temp src      SpO2      Weight      Height      Head Circumference      Peak Flow      Pain Score      Pain Loc       Pain Edu?      Excl. in GC?     Constitutional: Unresponsive Eyes: Pupils fixed and dilated, 5 mm bilaterally ENT   Head: Normocephalic and atraumatic.   Nose: No congestion/rhinnorhea.   Mouth/Throat: Mucous membranes are moist. Intubated with Brooke Dare airway   Neck: No stridor. Cardiovascular: No heart beat detected Respiratory: Clear breath sounds when ventilated with Brooke Dare airway Gastrointestinal: Mildly distended, colostomy is present Musculoskeletal: Extremities are unremarkable, no trauma evident Neurologic:  GCS is 3T Skin:  Preferred discoloration of the face and upper chest, no rashes noted ____________________________________________  ED COURSE:  Pertinent labs & imaging results that were available during my care of the patient were reviewed by me and considered in my medical decision making (see chart for details). Clinical Course  Patient presents to ER status post arrest, suspect drug ingestion with major cardiac event.  Marthenia Rolling Line Date/Time: 07-09-2016 3:45 PM Performed by: Emily Filbert Authorized by: Daryel November E   Consent:    Consent obtained:  Emergent situation Pre-procedure details:    Skin preparation:  2% chlorhexidine Anesthesia (see MAR for exact dosages):    Anesthesia method:  None Procedure details:    Location:  R femoral  Patient position:  Flat   Landmarks identified: yes     Ultrasound guidance: no     Number of attempts:  1   Successful placement: yes   Post-procedure details:    Post-procedure:  Dressing applied and line sutured   Assessment:  Free fluid flow and blood return through all ports   Patient tolerance of procedure:  Tolerated well, no immediate complications  CRITICAL CARE Performed by: Emily FilbertWilliams, Shayanna Thatch E   Total critical care time: 15 minutes  Critical care time was exclusive of separately billable procedures and treating other patients.  Critical care was necessary to treat or  prevent imminent or life-threatening deterioration.  Critical care was time spent personally by me on the following activities: development of treatment plan with patient and/or surrogate as well as nursing, discussions with consultants, evaluation of patient's response to treatment, examination of patient, obtaining history from patient or surrogate, ordering and performing treatments and interventions, ordering and review of laboratory studies, ordering and review of radiographic studies, pulse oximetry and re-evaluation of patient's condition.  ____________________________________________  FINAL ASSESSMENT AND PLAN  Cardiopulmonary arrest  Plan: Patient presented to the ER status post cardiopulmonary arrest. Patient with recent drug ingestion according to EMS report. He remained in asystole throughout his ER stay. He received CPR, given multiple rounds of epinephrine, atropine and additional doses of Narcan with bicarbonate. Patient remained in asystole throughout, no PEA or unstable rhythms identified. Uncertain as to the exact cause of death.   Emily FilbertWilliams, Grady Mohabir E, MD   Note: This dictation was prepared with Dragon dictation. Any transcriptional errors that result from this process are unintentional    Emily FilbertJonathan E Jonuel Butterfield, MD 06/21/16 1547

## 2016-06-22 NOTE — Code Documentation (Signed)
Pulse check, no pulse; CPR resumed 

## 2016-06-22 NOTE — Progress Notes (Signed)
Chaplain received a page from ED notifying him that there was a death, but family members were not present yet. Chaplain went to ED and returned when the family arrived. Family was devastated by losing their loved one to homicide. Chaplain prayed with the family members and left when they said that they wanted to go outside for a walk.

## 2016-06-22 NOTE — Code Documentation (Signed)
Patient time of death occurred at [id number].

## 2016-06-22 NOTE — Code Documentation (Signed)
Pulse check, no pulse. US used by ED MD. No cardiac movement. CPR resumed.

## 2016-06-22 NOTE — Code Documentation (Signed)
Pulse check, no cardiac activity on US or pulse. Time of death [id number].

## 2016-06-22 NOTE — ED Notes (Addendum)
Identification tag placed on pt's left great toe. Patient placed in body bag with all lines and tubes left in place. Identification tag tied to zipper of bag. Pt taken down to morgue by orderly accompanied by [employer] officer.

## 2016-06-22 NOTE — ED Notes (Signed)
Pt here via [facility], CPR in progress. EMS reports they arrived the scene and pt had overdosed, cardiac arrest at 1422, CPR started by EMS. EMS reports 11 epis given and 4mg  of narcan. EMS reports return of rhythm x3 and pt was in PEA x3. Pt was defibrilated x1. Pt arrived with IOs in bilateral shins, CBG 191. Pt with King airway in place.

## 2016-06-22 DEATH — deceased
